# Patient Record
Sex: Female | Born: 1995 | Race: White | Hispanic: No | Marital: Single | Smoking: Never smoker
Health system: Southern US, Community
[De-identification: ages and names within clinical notes are randomized; demographics above are authoritative.]

## PROBLEM LIST (undated history)

## (undated) ENCOUNTER — Inpatient Hospital Stay (HOSPITAL_COMMUNITY): Payer: Self-pay

## (undated) DIAGNOSIS — J45909 Unspecified asthma, uncomplicated: Secondary | ICD-10-CM

---

## 2014-02-14 ENCOUNTER — Emergency Department (HOSPITAL_COMMUNITY): Payer: Medicaid Other

## 2014-02-14 ENCOUNTER — Emergency Department (HOSPITAL_COMMUNITY)
Admission: EM | Admit: 2014-02-14 | Discharge: 2014-02-15 | Disposition: A | Discharge status: Home / Self Care | Payer: Medicaid Other | Attending: Emergency Medicine | Admitting: Emergency Medicine

## 2014-02-14 ENCOUNTER — Encounter (HOSPITAL_COMMUNITY): Payer: Self-pay | Admitting: Emergency Medicine

## 2014-02-14 DIAGNOSIS — N9489 Other specified conditions associated with female genital organs and menstrual cycle: Secondary | ICD-10-CM | POA: Insufficient documentation

## 2014-02-14 DIAGNOSIS — Z79899 Other long term (current) drug therapy: Secondary | ICD-10-CM | POA: Insufficient documentation

## 2014-02-14 DIAGNOSIS — O020 Blighted ovum and nonhydatidiform mole: Secondary | ICD-10-CM

## 2014-02-14 DIAGNOSIS — O9989 Other specified diseases and conditions complicating pregnancy, childbirth and the puerperium: Secondary | ICD-10-CM | POA: Insufficient documentation

## 2014-02-14 DIAGNOSIS — O200 Threatened abortion: Secondary | ICD-10-CM

## 2014-02-14 DIAGNOSIS — R110 Nausea: Secondary | ICD-10-CM | POA: Insufficient documentation

## 2014-02-14 DIAGNOSIS — O039 Complete or unspecified spontaneous abortion without complication: Secondary | ICD-10-CM | POA: Insufficient documentation

## 2014-02-14 DIAGNOSIS — Z8619 Personal history of other infectious and parasitic diseases: Secondary | ICD-10-CM | POA: Insufficient documentation

## 2014-02-14 DIAGNOSIS — J45909 Unspecified asthma, uncomplicated: Secondary | ICD-10-CM | POA: Insufficient documentation

## 2014-02-14 HISTORY — DX: Unspecified asthma, uncomplicated: J45.909

## 2014-02-14 LAB — POCT PREGNANCY, URINE: PREG TEST UR: POSITIVE — AB

## 2014-02-14 LAB — CBC
HCT: 39.6 % (ref 36.0–46.0)
Hemoglobin: 14.6 g/dL (ref 12.0–15.0)
MCH: 33.3 pg (ref 26.0–34.0)
MCHC: 36.9 g/dL — AB (ref 30.0–36.0)
MCV: 90.2 fL (ref 78.0–100.0)
PLATELETS: 421 10*3/uL — AB (ref 150–400)
RBC: 4.39 MIL/uL (ref 3.87–5.11)
RDW: 11.9 % (ref 11.5–15.5)
WBC: 13.8 10*3/uL — ABNORMAL HIGH (ref 4.0–10.5)

## 2014-02-14 LAB — URINALYSIS, ROUTINE W REFLEX MICROSCOPIC
Bilirubin Urine: NEGATIVE
Glucose, UA: NEGATIVE mg/dL
Ketones, ur: NEGATIVE mg/dL
NITRITE: NEGATIVE
PROTEIN: NEGATIVE mg/dL
SPECIFIC GRAVITY, URINE: 1.017 (ref 1.005–1.030)
UROBILINOGEN UA: 0.2 mg/dL (ref 0.0–1.0)
pH: 6.5 (ref 5.0–8.0)

## 2014-02-14 LAB — HCG, QUANTITATIVE, PREGNANCY: HCG, BETA CHAIN, QUANT, S: 16398 m[IU]/mL — AB (ref <5)

## 2014-02-14 LAB — ABO/RH: ABO/RH(D): A POS

## 2014-02-14 LAB — URINE MICROSCOPIC-ADD ON

## 2014-02-14 LAB — WET PREP, GENITAL
Clue Cells Wet Prep HPF POC: NONE SEEN
Trich, Wet Prep: NONE SEEN
YEAST WET PREP: NONE SEEN

## 2014-02-14 MED ORDER — ACETAMINOPHEN 325 MG PO TABS
650.0000 mg | ORAL_TABLET | Freq: Once | ORAL | Status: AC
  Administered 2014-02-14: 650 mg via ORAL
  Filled 2014-02-14: qty 2

## 2014-02-14 MED ORDER — ONDANSETRON 4 MG PO TBDP
4.0000 mg | ORAL_TABLET | Freq: Once | ORAL | Status: AC
  Administered 2014-02-14: 4 mg via ORAL
  Filled 2014-02-14: qty 1

## 2014-02-14 NOTE — ED Notes (Signed)
PA at bedside.

## 2014-02-14 NOTE — ED Notes (Signed)
Escorted patient to ultrasound.

## 2014-02-14 NOTE — ED Notes (Signed)
Pt reports she is [redacted] weeks pregnant yesterday had vaginal bleeding and [facility] was told she was not having miscarriage. Bleeding stopped. Today at 1500 bleeding started back again. Reports both bright and dark red blood, no clots. This is first pregnancy. Lower abdominal cramping. Dx with trichomonas yesterday. Pt is a x 4. Significant other at bedside.

## 2014-02-14 NOTE — ED Provider Notes (Signed)
CSN: [id number]     Arrival date & time 02/14/14  1631 History   First MD Initiated Contact with Patient 02/14/14 1830     Chief Complaint  Patient presents with  . Vaginal Bleeding     (Consider location/radiation/quality/duration/timing/severity/associated sxs/prior Treatment) HPI Comments: Angela Huynh is a 52 G59P63 year-old female with a past medical history of Trichomonas and genital warts, presenting the Emergency Department with a chief complaint of abnormal bleeding since yesterday.  The patient reports she had light spotting yesterday and was evaluated at [facility] [facility] yesterday and was diagnosed with trichomonas.  She reports an increase in blood since yesterday and mild lower abdominal cramps.  She reports LNMP 12/14/2013 and pregnancy was confirmed by a UA at the [facility] department.The patient states she has not established care with an OB. She reports associated nausea that has been present through out the pregnancy.    Patient is a 18 y.o. female presenting with vaginal bleeding. The history is provided by the patient.  Vaginal Bleeding Associated symptoms: abdominal pain and nausea   Associated symptoms: no dysuria, no fever and no vaginal discharge     Past Medical History  Diagnosis Date  . Asthma    History reviewed. No pertinent past surgical history. No family history on file. History  Substance Use Topics  . Smoking status: Never Smoker   . Smokeless tobacco: Not on file  . Alcohol Use: No   OB History   Grav Para Term Preterm Abortions TAB SAB Ect Mult Living   1              Review of Systems  Constitutional: Negative for fever and chills.  Gastrointestinal: Positive for nausea and abdominal pain. Negative for diarrhea and constipation.  Genitourinary: Positive for vaginal bleeding, genital sores and pelvic pain. Negative for dysuria, flank pain and vaginal discharge.      Allergies  Review of patient's allergies  indicates no known allergies.  Home Medications   Current Outpatient Rx  Name  Route  Sig  Dispense  Refill  . albuterol (PROVENTIL HFA;VENTOLIN HFA) 108 (90 BASE) MCG/ACT inhaler   Inhalation   Inhale 2 puffs into the lungs every 6 (six) hours as needed for wheezing or shortness of breath.         . vitamin C (ASCORBIC ACID) 500 MG tablet   Oral   Take 1,000 mg by mouth daily as needed (feeling under the weather).         . metroNIDAZOLE (FLAGYL) 500 MG tablet   Oral   Take 2,000 mg by mouth once.          BP 116/70  Pulse 115  Temp(Src) 98 F (36.7 C) (Oral)  Resp 16  SpO2 100% Physical Exam  Nursing note and vitals reviewed. Constitutional: She is oriented to person, place, and time. She appears well-developed and well-nourished. No distress.  HENT:  Head: Normocephalic and atraumatic.  Eyes: EOM are normal. Pupils are equal, round, and reactive to light.  Neck: Neck supple.  Cardiovascular: Normal rate and regular rhythm.   Pulmonary/Chest: No respiratory distress. She has no wheezes. She has no rales.  Abdominal: Soft. She exhibits no mass. There is tenderness. There is no rebound and no guarding.  Genitourinary: There is lesion on the right labia. There is lesion on the left labia. Uterus is tender. Cervix exhibits discharge. Right adnexum displays no mass and no tenderness. Left adnexum displays no mass and no tenderness.  Cervical os slightly dilated.  Moderate amount of blood in the posterior vaginal vault.  Small clots no obvious tissue. Approximately 30 genital warts to bilateral labia.  Musculoskeletal: Normal range of motion.  Neurological: She is alert and oriented to person, place, and time.  Skin: Skin is warm and dry. She is not diaphoretic.  Psychiatric: She has a normal mood and affect. Her behavior is normal.    ED Course  Procedures (including critical care time) Labs Review Labs Reviewed  CBC - Abnormal; Notable for the following:    WBC  13.8 (*)    MCHC 36.9 (*)    Platelets 421 (*)    All other components within normal limits  URINALYSIS, ROUTINE W REFLEX MICROSCOPIC - Abnormal; Notable for the following:    APPearance HAZY (*)    Hgb urine dipstick LARGE (*)    Leukocytes, UA MODERATE (*)    All other components within normal limits  URINE MICROSCOPIC-ADD ON - Abnormal; Notable for the following:    Squamous Epithelial / LPF MANY (*)    Bacteria, UA FEW (*)    All other components within normal limits  POCT PREGNANCY, URINE - Abnormal; Notable for the following:    Preg Test, Ur POSITIVE (*)    All other components within normal limits  HCG, QUANTITATIVE, PREGNANCY  ABO/RH   Imaging Review US OB Transvaginal (Final result)  Result time: 02/14/14 23:25:05    Final result by Rad Results In Interface (02/14/14 23:25:05)    Narrative:   CLINICAL DATA: Bleeding.  EXAM: OBSTETRIC <14 WK US AND TRANSVAGINAL OB US  TECHNIQUE: Both transabdominal and transvaginal ultrasound examinations were performed for complete evaluation of the gestation as well as the maternal uterus, adnexal regions, and pelvic cul-de-sac. Transvaginal technique was performed to assess early pregnancy.  COMPARISON: None.  FINDINGS: Intrauterine gestational sac: Visualized/normal in shape.  Yolk sac: Not visualized  Embryo: Not visualized  Cardiac Activity: Not visualized  Heart Rate: bpm  MSD: 13.1 mm 6 w 1 d  CRL: mm w d US EDC: 10/09/2014  Maternal uterus/adnexae: No subchorionic hemorrhage. Ovaries are symmetric in size and echotexture. No adnexal masses. Small amount of free fluid in the pelvis.  IMPRESSION: Early intrauterine gestational sac without yolk sac or fetal pole currently. Recommend followup with serial quantitative beta HCGs and repeat ultrasound in 10-14 days.   Electronically Signed By: Charlett NoseKevin Dover M.D. On: 02/14/2014 23:25             US OB Comp Less 14 Wks (Final result)  Result time:  02/14/14 23:25:05    Final result by Rad Results In Interface (02/14/14 23:25:05)    Narrative:   CLINICAL DATA: Bleeding.  EXAM: OBSTETRIC <14 WK US AND TRANSVAGINAL OB US  TECHNIQUE: Both transabdominal and transvaginal ultrasound examinations were performed for complete evaluation of the gestation as well as the maternal uterus, adnexal regions, and pelvic cul-de-sac. Transvaginal technique was performed to assess early pregnancy.  COMPARISON: None.  FINDINGS: Intrauterine gestational sac: Visualized/normal in shape.  Yolk sac: Not visualized  Embryo: Not visualized  Cardiac Activity: Not visualized  Heart Rate: bpm  MSD: 13.1 mm 6 w 1 d  CRL: mm w d US EDC: 10/09/2014  Maternal uterus/adnexae: No subchorionic hemorrhage. Ovaries are symmetric in size and echotexture. No adnexal masses. Small amount of free fluid in the pelvis.  IMPRESSION: Early intrauterine gestational sac without yolk sac or fetal pole currently. Recommend followup with serial quantitative beta HCGs and repeat ultrasound  in 10-14 days.   Electronically Signed By: Charlett Nose M.D. On: 02/14/2014 23:25     EKG Interpretation   None       MDM   Final diagnoses:  Empty gestational sac with ongoing pregnancy  Threatened miscarriage    Pt with abnormal bleeding and cramping with a positive UA pregnancy test. Was evaluated by [facility] yesterday. PE shows moderate amount of blood and uterine tenderness.  Minimal dilation of the cervix. Concerning for immanent miscarriage. [place] to evaluate for ectopic. Hcg: 16398, WBC on wet prep without trich. US shows Intrauterine gestational sac without fetal pole, or yolk sac.  MSD 6w 1d. Discussed following up with an OB in 2 days for hCG and possible [place]. Discussed lab results, imaging results, and treatment plan with the patient. Return precautions given. Reports understanding and no other concerns at this time.  Patient is stable for discharge  at this time.     Clabe Seal, PA-C 02/16/14 2025

## 2014-02-15 ENCOUNTER — Encounter (HOSPITAL_COMMUNITY): Payer: Self-pay | Admitting: *Deleted

## 2014-02-15 ENCOUNTER — Inpatient Hospital Stay (HOSPITAL_COMMUNITY)
Admission: AD | Admit: 2014-02-15 | Discharge: 2014-02-15 | Disposition: A | Discharge status: Home / Self Care | Payer: Medicaid Other | Source: Ambulatory Visit | Attending: Obstetrics & Gynecology | Admitting: Obstetrics & Gynecology

## 2014-02-15 DIAGNOSIS — O209 Hemorrhage in early pregnancy, unspecified: Secondary | ICD-10-CM

## 2014-02-15 DIAGNOSIS — O210 Mild hyperemesis gravidarum: Secondary | ICD-10-CM | POA: Insufficient documentation

## 2014-02-15 DIAGNOSIS — R109 Unspecified abdominal pain: Secondary | ICD-10-CM | POA: Insufficient documentation

## 2014-02-15 DIAGNOSIS — O26859 Spotting complicating pregnancy, unspecified trimester: Secondary | ICD-10-CM | POA: Insufficient documentation

## 2014-02-15 LAB — GC/CHLAMYDIA PROBE AMP
CT PROBE, AMP APTIMA: NEGATIVE
GC PROBE AMP APTIMA: NEGATIVE

## 2014-02-15 LAB — HCG, QUANTITATIVE, PREGNANCY: hCG, Beta Chain, Quant, S: 19832 m[IU]/mL — ABNORMAL HIGH (ref <5)

## 2014-02-15 MED ORDER — PROMETHAZINE HCL 25 MG PO TABS: 25.0000 mg | ORAL_TABLET | Freq: Four times a day (QID) | ORAL | Status: DC | PRN

## 2014-02-15 MED ORDER — ONDANSETRON 8 MG PO TBDP: 8.0000 mg | ORAL_TABLET | Freq: Three times a day (TID) | ORAL | Status: DC | PRN

## 2014-02-15 MED ORDER — ONDANSETRON 8 MG PO TBDP
8.0000 mg | ORAL_TABLET | Freq: Once | ORAL | Status: AC
  Administered 2014-02-15: 8 mg via ORAL
  Filled 2014-02-15: qty 1

## 2014-02-15 MED ORDER — ACETAMINOPHEN 500 MG PO TABS
1000.0000 mg | ORAL_TABLET | Freq: Once | ORAL | Status: AC
  Administered 2014-02-15: 1000 mg via ORAL
  Filled 2014-02-15: qty 2

## 2014-02-15 NOTE — MAU Note (Signed)
Been 2 different doctors [facility]- dx with trich and treated; 2nd- [facility]- no trich- empty sac). Been having some bleeding started on Tues.   Is afraid she is having a miscarriage.

## 2014-02-15 NOTE — MAU Provider Note (Signed)
History     CSN: [id number]  Arrival date and time: 02/15/14 1419   None     No chief complaint on file.  HPI Pt is [redacted]w[redacted]d G1P0 that has been seen in [facility] with spotting and bleeding in pregnancy And then at [facility] yesterday.  Pt has had HCGs and then ultrasound yesterday.  Records from [facility] are not available to view at this time.  Pt has been spotting since 2/17 and is cramping. Pt was told to f/u here within 48 hours and is here for  Further evaluation.   HCG from 02/14/2014- 16398 US showed  IUGS no YS or embryo; normal ovaries on 02/14/2014 Pt was told she was having a miscarriage- pt is confused Pt also treated for trich - partner has not yet been treated GC/chlamydia from 02/14/2014 - neg  RN note: Been 2 different doctors [facility]- dx with trich and treated; 2nd- [facility]- no trich- empty sac). Been having some bleeding started on Tues. Is afraid she is having a miscarriage.    Started spotting on Tues night. Has increased. Cramping in lower abd.   Past Medical History  Diagnosis Date  . Asthma     History reviewed. No pertinent past surgical history.  History reviewed. No pertinent family history.  History  Substance Use Topics  . Smoking status: Never Smoker   . Smokeless tobacco: Not on file  . Alcohol Use: No    Allergies:  Allergies  Allergen Reactions  . Corn-Containing Products Nausea And Vomiting    Prescriptions prior to admission  Medication Sig Dispense Refill  . acetaminophen (TYLENOL) 500 MG tablet Take 1,000 mg by mouth every 6 (six) hours as needed for headache.      . metroNIDAZOLE (FLAGYL) 500 MG tablet Take 2,000 mg by mouth once.      . vitamin C (ASCORBIC ACID) 500 MG tablet Take 1,000 mg by mouth daily as needed (feeling under the weather).      Marland Kitchen albuterol (PROVENTIL HFA;VENTOLIN HFA) 108 (90 BASE) MCG/ACT inhaler Inhale 2 puffs into the lungs every 6 (six) hours as needed for wheezing or shortness of breath.         ROS Physical Exam   Blood pressure 118/71, pulse 89, temperature 98.5 F (36.9 C), temperature source Oral, resp. rate 18, height 4\' 11"  (1.499 m), weight 44.18 kg (97 lb 6.4 oz), last menstrual period 12/06/2013.  Physical Exam  Nursing note and vitals reviewed. Constitutional: She is oriented to person, place, and time. She appears well-developed and well-nourished. No distress.  HENT:  Head: Normocephalic.  Eyes: Pupils are equal, round, and reactive to light.  Neck: Normal range of motion. Neck supple.  Cardiovascular: Normal rate.   Respiratory: Effort normal.  GI: Soft.  Musculoskeletal: Normal range of motion.  Neurological: She is alert and oriented to person, place, and time.  Skin: Skin is warm and dry.  Psychiatric: She has a normal mood and affect.    MAU Course  Procedures  Review of labs and ultrasound from yesterday 02/14/2014: US Ob Comp Less 14 Wks  02/14/2014   CLINICAL DATA:  Bleeding.  EXAM: OBSTETRIC <14 WK [place] AND TRANSVAGINAL OB US  TECHNIQUE: Both transabdominal and transvaginal ultrasound examinations were performed for complete evaluation of the gestation as well as the maternal uterus, adnexal regions, and pelvic cul-de-sac. Transvaginal technique was performed to assess early pregnancy.  COMPARISON:  None.  FINDINGS: Intrauterine gestational sac: Visualized/normal in shape.  Yolk sac:  Not visualized  Embryo:  Not visualized  Cardiac Activity: Not visualized  Heart Rate:   bpm  MSD:  13.1  mm   6 w   1  d  CRL:     mm    w  d                  US EDC: 10/09/2014  Maternal uterus/adnexae: No subchorionic hemorrhage. Ovaries are symmetric in size and echotexture. No adnexal masses. Small amount of free fluid in the pelvis.  IMPRESSION: Early intrauterine gestational sac without yolk sac or fetal pole currently. Recommend followup with serial quantitative beta HCGs and repeat ultrasound in 10-14 days.   Electronically Signed   By: Charlett NoseKevin  Dover M.D.   On:  02/14/2014 23:25   [place] Ob Transvaginal  02/14/2014   CLINICAL DATA:  Bleeding.  EXAM: OBSTETRIC <14 WK US AND TRANSVAGINAL OB US  TECHNIQUE: Both transabdominal and transvaginal ultrasound examinations were performed for complete evaluation of the gestation as well as the maternal uterus, adnexal regions, and pelvic cul-de-sac. Transvaginal technique was performed to assess early pregnancy.  COMPARISON:  None.  FINDINGS: Intrauterine gestational sac: Visualized/normal in shape.  Yolk sac:  Not visualized  Embryo:  Not visualized  Cardiac Activity: Not visualized  Heart Rate:   bpm  MSD:  13.1  mm   6 w   1  d  CRL:     mm    w  d                  US EDC: 10/09/2014  Maternal uterus/adnexae: No subchorionic hemorrhage. Ovaries are symmetric in size and echotexture. No adnexal masses. Small amount of free fluid in the pelvis.  IMPRESSION: Early intrauterine gestational sac without yolk sac or fetal pole currently. Recommend followup with serial quantitative beta HCGs and repeat ultrasound in 10-14 days.   Electronically Signed   By: Charlett NoseKevin  Dover M.D.   On: 02/14/2014 23:25   Results for orders placed during the hospital encounter of 02/14/14 (from the past 48 hour(s))  CBC     Status: Abnormal   Collection Time    02/14/14  4:57 PM      Result Value Ref Range   WBC 13.8 (*) 4.0 - 10.5 K/uL   RBC 4.39  3.87 - 5.11 MIL/uL   Hemoglobin 14.6  12.0 - 15.0 g/dL   HCT 40.939.6  81.136.0 - 91.446.0 %   MCV 90.2  78.0 - 100.0 fL   MCH 33.3  26.0 - 34.0 pg   MCHC 36.9 (*) 30.0 - 36.0 g/dL   RDW 78.211.9  95.611.5 - 21.315.5 %   Platelets 421 (*) 150 - 400 K/uL  URINALYSIS, ROUTINE W REFLEX MICROSCOPIC     Status: Abnormal   Collection Time    02/14/14  5:03 PM      Result Value Ref Range   Color, Urine YELLOW  YELLOW   APPearance HAZY (*) CLEAR   Specific Gravity, Urine 1.017  1.005 - 1.030   pH 6.5  5.0 - 8.0   Glucose, UA NEGATIVE  NEGATIVE mg/dL   Hgb urine dipstick LARGE (*) NEGATIVE   Bilirubin Urine NEGATIVE  NEGATIVE    Ketones, ur NEGATIVE  NEGATIVE mg/dL   Protein, ur NEGATIVE  NEGATIVE mg/dL   Urobilinogen, UA 0.2  0.0 - 1.0 mg/dL   Nitrite NEGATIVE  NEGATIVE   Leukocytes, UA MODERATE (*) NEGATIVE  URINE MICROSCOPIC-ADD ON     Status:  Abnormal   Collection Time    02/14/14  5:03 PM      Result Value Ref Range   Squamous Epithelial / LPF MANY (*) RARE   WBC, UA 3-6  <3 WBC/hpf   RBC / HPF 7-10  <3 RBC/hpf   Bacteria, UA FEW (*) RARE   Urine-Other MUCOUS PRESENT    POCT PREGNANCY, URINE     Status: Abnormal   Collection Time    02/14/14  5:08 PM      Result Value Ref Range   Preg Test, Ur POSITIVE (*) NEGATIVE   Comment:            THE SENSITIVITY OF THIS     METHODOLOGY IS >24 mIU/mL  ABO/RH     Status: None   Collection Time    02/14/14  6:44 PM      Result Value Ref Range   ABO/RH(D) A POS     No rh immune globuloin NOT A RH IMMUNE GLOBULIN CANDIDATE, PT RH POSITIVE    HCG, QUANTITATIVE, PREGNANCY     Status: Abnormal   Collection Time    02/14/14  6:44 PM      Result Value Ref Range   hCG, Beta Chain, Quant, S [id number] (*) <5 mIU/mL   Comment:              GEST. AGE      CONC.  (mIU/mL)       <=1 WEEK        5 - 50         2 WEEKS       50 - 500         3 WEEKS       100 - 10,000         4 WEEKS     1,000 - 30,000         5 WEEKS     3,500 - 115,000       6-8 WEEKS     12,000 - 270,000        12 WEEKS     15,000 - 220,000                FEMALE AND NON-PREGNANT FEMALE:         LESS THAN 5 mIU/mL  GC/CHLAMYDIA PROBE AMP     Status: None   Collection Time    02/14/14  8:51 PM      Result Value Ref Range   CT Probe RNA NEGATIVE  NEGATIVE   GC Probe RNA NEGATIVE  NEGATIVE   Comment: (NOTE)                                                                                               **Normal Reference Range: Negative**          Assay performed using the Gen-Probe APTIMA COMBO2 (R) Assay.     Acceptable specimen types for this assay include APTIMA Swabs (Unisex,     endocervical,  urethral, or vaginal), first void urine, and ThinPrep     liquid based cytology samples.  Performed at [facility]  WET PREP, GENITAL     Status: Abnormal   Collection Time    02/14/14  8:51 PM      Result Value Ref Range   Yeast Wet Prep HPF POC NONE SEEN  NONE SEEN   Trich, Wet Prep NONE SEEN  NONE SEEN   Clue Cells Wet Prep HPF POC NONE SEEN  NONE SEEN   WBC, Wet Prep HPF POC MODERATE (*) NONE SEEN   Results for orders placed during the hospital encounter of 02/15/14 (from the past 24 hour(s))  HCG, QUANTITATIVE, PREGNANCY     Status: Abnormal   Collection Time    02/15/14  4:19 PM      Result Value Ref Range   hCG, Beta Chain, Quant, Kathie Rhodes [id number] (*) <5 mIU/mL   Discussed with Dr. Macon Large- will f/u in 48 hours for repeat HCG Pt started feeling nauseated and more cramping- Zofran 8mg  ODT and Tylenol 1000mg  PO given  Assessment and Plan  Bleeding in pregnancy F/u in 48 hrs for repeat HCG Bleeding precautions Morning sickness- Rx for Zofran and Phenergan- (phenergan called in to pharmancy; Zofran e-scribed) Keaundre Thelin 02/15/2014, 3:52 PM

## 2014-02-15 NOTE — ED Notes (Signed)
Pt is upset and crying.

## 2014-02-15 NOTE — MAU Provider Note (Signed)
Attestation of Attending Supervision of Advanced Practitioner (PA/CNM/NP): Evaluation and management procedures were performed by the Advanced Practitioner under my supervision and collaboration.  I have reviewed the Advanced Practitioner's note and chart, and I agree with the management and plan.  Francessca Friis, MD, FACOG Attending Obstetrician & Gynecologist Faculty Practice, [facility]  

## 2014-02-15 NOTE — Discharge Instructions (Signed)
[facility] for a follow up appointment for a repeat hCG (hormone of pregnancy) and a possible ultra-sound.  Call for a follow up appointment with a Family or Primary Care Provider.  Return if Symptoms worsen.   Take medication as prescribed. Take tylenol for pain.

## 2014-02-18 ENCOUNTER — Inpatient Hospital Stay (HOSPITAL_COMMUNITY)
Admission: AD | Admit: 2014-02-18 | Discharge: 2014-02-18 | Disposition: A | Discharge status: Home / Self Care | Payer: Medicaid Other | Source: Ambulatory Visit | Attending: Obstetrics & Gynecology | Admitting: Obstetrics & Gynecology

## 2014-02-18 ENCOUNTER — Encounter (HOSPITAL_COMMUNITY): Payer: Self-pay | Admitting: *Deleted

## 2014-02-18 DIAGNOSIS — O200 Threatened abortion: Secondary | ICD-10-CM

## 2014-02-18 DIAGNOSIS — O4690 Antepartum hemorrhage, unspecified, unspecified trimester: Secondary | ICD-10-CM

## 2014-02-18 LAB — HCG, QUANTITATIVE, PREGNANCY: hCG, Beta Chain, Quant, S: 25271 m[IU]/mL — ABNORMAL HIGH (ref <5)

## 2014-02-18 NOTE — MAU Provider Note (Signed)
Attestation of Attending Supervision of Advanced Practitioner (CNM/NP): Evaluation and management procedures were performed by the Advanced Practitioner under my supervision and collaboration. I have reviewed the Advanced Practitioner's note and chart, and I agree with the management and plan.  Kayshawn Ozburn H. 8:36 PM

## 2014-02-18 NOTE — MAU Note (Signed)
Pt presents for repeat labs

## 2014-02-18 NOTE — MAU Provider Note (Signed)
HPI:  [name] is a 18 y.o. female G1P0 at 7037w5d who presents to MAU for a repeat beta hcg level. The patient currently has no pain and very minimal bleeding. When patient was seen on 2/18 she described heavy vaginal bleeding; bleeding is very minimal now.    Objective:  GENERAL: Well-developed, well-nourished female in no acute distress.  HEENT: Normocephalic, atraumatic.   LUNGS: Effort normal HEART: Regular rate  SKIN: Warm, dry and without erythema PSYCH: Normal mood and affect    Results for orders placed during the hospital encounter of 02/18/14 (from the past 48 hour(s))  HCG, QUANTITATIVE, PREGNANCY     Status: Abnormal   Collection Time    02/18/14  3:21 PM      Result Value Ref Range   hCG, Beta Chain, Quant, S [id number] (*) <5 mIU/mL   Comment:              GEST. AGE      CONC.  (mIU/mL)       <=1 WEEK        5 - 50         2 WEEKS       50 - 500         3 WEEKS       100 - 10,000         4 WEEKS     1,000 - 30,000         5 WEEKS     3,500 - 115,000       6-8 WEEKS     12,000 - 270,000        12 WEEKS     15,000 - 220,000                FEMALE AND NON-PREGNANT FEMALE:         LESS THAN 5 mIU/mL    CLINICAL DATA: Bleeding.  EXAM:  OBSTETRIC <14 WK US AND TRANSVAGINAL OB US  TECHNIQUE:  Both transabdominal and transvaginal ultrasound examinations were  performed for complete evaluation of the gestation as well as the  maternal uterus, adnexal regions, and pelvic cul-de-sac.  Transvaginal technique was performed to assess early pregnancy.  COMPARISON: None.  FINDINGS:  Intrauterine gestational sac: Visualized/normal in shape.  Yolk sac: Not visualized  Embryo: Not visualized  Cardiac Activity: Not visualized  Heart Rate: bpm  MSD: 13.1 mm 6 w 1 d  CRL: mm w d US EDC: 10/09/2014  Maternal uterus/adnexae: No subchorionic hemorrhage. Ovaries are  symmetric in size and echotexture. No adnexal masses. Small amount  of free fluid in the pelvis.  IMPRESSION:   Early intrauterine gestational sac without yolk sac or fetal pole  currently. Recommend followup with serial quantitative beta HCGs and  repeat ultrasound in 10-14 days.  Electronically Signed  By: Charlett NoseKevin Dover M.D.  On: 02/14/2014 23:25    MDM  Quant 2/18: [id number] Quant 2/19: [id number]: [id number] Quant 2/21: [id number] A positive blood type  Consulted with Dr. Penne LashLeggett     A:  Threatened miscarriage, likely blighted ovum  Vaginal bleeding in pregnancy  P:  Discharge home in stable condition Repeat US in 10 days Return to MAU with any pain or increased bleeding Pelvic rest  Bleeding precautions discussed   Iona HansenJennifer Irene Lonzell Dorris, NP 02/18/2014 4:57 PM

## 2014-02-18 NOTE — Discharge Instructions (Signed)
Threatened Miscarriage  Bleeding during the first 20 weeks of pregnancy is common. This is sometimes called a threatened miscarriage. This is a pregnancy that is threatening to end before the twentieth week of pregnancy. Often this bleeding stops with bed rest or decreased activities as suggested by your caregiver and the pregnancy continues without any more problems. You may be asked to not have sexual intercourse, have orgasms or use tampons until further notice. Sometimes a threatened miscarriage can progress to a complete or incomplete miscarriage. This may or may not require further treatment. Some miscarriages occur before a woman misses a menstrual period and knows she is pregnant.  Miscarriages occur in 15 to 20% of all pregnancies and usually occur during the first 13 weeks of the pregnancy. The exact cause of a miscarriage is usually never known. A miscarriage is natures way of ending a pregnancy that is abnormal or would not make it to term. There are some things that may put you at risk to have a miscarriage, such as:  · Hormone problems.  · Infection of the uterus or cervix.  · Chronic illness, diabetes for example, especially if it is not controlled.  · Abnormal shaped uterus.  · Fibroids in the uterus.  · Incompetent cervix (the cervix is too weak to hold the baby).  · Smoking.  · Drinking too much alcohol. It's best not to drink any alcohol when you are pregnant.  · Taking illegal drugs.  TREATMENT   When a miscarriage becomes complete and all products of conception (all the tissue in the uterus) have been passed, often no treatment is needed. If you think you passed tissue, save it in a container and take it to your doctor for evaluation. If the miscarriage is incomplete (parts of the fetus or placenta remain in the uterus), further treatment may be needed. The most common reason for further treatment is continued bleeding (hemorrhage) because pregnancy tissue did not pass out of the uterus. This  often occurs if a miscarriage is incomplete. Tissue left behind may also become infected. Treatment usually is dilatation and curettage (the removal of the remaining products of pregnancy. This can be done by a simple sucking procedure (suction curettage) or a simple scraping of the inside of the uterus. This may be done in the hospital or in the caregiver's office. This is only done when your caregiver knows that there is no chance for the pregnancy to proceed to term. This is determined by physical examination, negative pregnancy test, falling pregnancy hormone count and/or, an ultrasound revealing a dead fetus.  Miscarriages are often a very emotional time for prospective mothers and fathers. This is not you or your partners fault. It did not occur because of an inadequacy in you or your partner. Nearly all miscarriages occur because the pregnancy has started off wrongly. At least half of these pregnancies have a chromosomal abnormality. It is almost always not inherited. Others may have developmental problems with the fetus or placenta. This does not always show up even when the products miscarried are studied under the microscope. The miscarriage is nearly always not your fault and it is not likely that you could have prevented it from happening. If you are having emotional and grieving problems, talk to your health care provider and even seek counseling, if necessary, before getting pregnant again. You can begin trying for another pregnancy as soon as your caregiver says it is OK.  HOME CARE INSTRUCTIONS   · Your caregiver may order   bed rest depending on how much bleeding and cramping you are having. You may be limited to only getting up to go to the bathroom. You may be allowed to continue light activity. You may need to make arrangements for the care of your other children and for any other responsibilities.  · Keep track of the number of pads you use each day, how often you have to change pads and how  saturated (soaked) they are. Record this information.  · DO NOT USE TAMPONS. Do not douche, have sexual intercourse or orgasms until approved by your caregiver.  · You may receive a follow up appointment for re-evaluation of your pregnancy and a repeat blood test. Re-evaluation often occurs after 2 days and again in 4 to 6 weeks. It is very important that you follow-up in the recommended time period.  · If you are Rh negative and the father is Rh positive or you do not know the fathers' blood type, you may receive a shot (Rh immune globulin) to help prevent abnormal antibodies that can develop and affect the baby in any future pregnancies.  SEEK IMMEDIATE MEDICAL CARE IF:  · You have severe cramps in your stomach, back, or abdomen.  · You have a sudden onset of severe pain in the lower part of your abdomen.  · You develop chills.  · You run an unexplained temperature of 101° F (38.3° C) or higher.  · You pass large clots or tissue. Save any tissue for your caregiver to inspect.  · Your bleeding increases or you become light-headed, weak, or have fainting episodes.  · You have a gush of fluid from your vagina.  · You pass out. This could mean you have a tubal (ectopic) pregnancy.  Document Released: 12/14/2005 Document Revised: 03/07/2012 Document Reviewed: 07/30/2008  ExitCare® Patient Information ©2014 [employer].

## 2014-02-18 NOTE — ED Provider Notes (Signed)
Medical screening examination/treatment/procedure(s) were performed by non-physician practitioner and as supervising physician I was immediately available for consultation/collaboration.   Angela ChurnJohn David Amey Hossain III, MD 02/18/14 [phone]

## 2014-02-23 ENCOUNTER — Ambulatory Visit (HOSPITAL_COMMUNITY)
Admission: RE | Admit: 2014-02-23 | Discharge: 2014-02-23 | Disposition: A | Discharge status: Home / Self Care | Payer: Medicaid Other | Source: Ambulatory Visit | Attending: Obstetrics and Gynecology | Admitting: Obstetrics and Gynecology

## 2014-02-23 ENCOUNTER — Inpatient Hospital Stay (HOSPITAL_COMMUNITY)
Admission: AD | Admit: 2014-02-23 | Discharge: 2014-02-23 | Disposition: A | Discharge status: Home / Self Care | Payer: Medicaid Other | Source: Ambulatory Visit | Attending: Obstetrics & Gynecology | Admitting: Obstetrics & Gynecology

## 2014-02-23 DIAGNOSIS — O4690 Antepartum hemorrhage, unspecified, unspecified trimester: Secondary | ICD-10-CM

## 2014-02-23 DIAGNOSIS — O200 Threatened abortion: Secondary | ICD-10-CM | POA: Insufficient documentation

## 2014-02-23 DIAGNOSIS — Z09 Encounter for follow-up examination after completed treatment for conditions other than malignant neoplasm: Secondary | ICD-10-CM

## 2014-02-23 NOTE — Discharge Instructions (Signed)
Threatened Miscarriage  Bleeding during the first 20 weeks of pregnancy is common. This is sometimes called a threatened miscarriage. This is a pregnancy that is threatening to end before the twentieth week of pregnancy. Often this bleeding stops with bed rest or decreased activities as suggested by your caregiver and the pregnancy continues without any more problems. You may be asked to not have sexual intercourse, have orgasms or use tampons until further notice. Sometimes a threatened miscarriage can progress to a complete or incomplete miscarriage. This may or may not require further treatment. Some miscarriages occur before a woman misses a menstrual period and knows she is pregnant.  Miscarriages occur in 15 to 20% of all pregnancies and usually occur during the first 13 weeks of the pregnancy. The exact cause of a miscarriage is usually never known. A miscarriage is natures way of ending a pregnancy that is abnormal or would not make it to term. There are some things that may put you at risk to have a miscarriage, such as:  · Hormone problems.  · Infection of the uterus or cervix.  · Chronic illness, diabetes for example, especially if it is not controlled.  · Abnormal shaped uterus.  · Fibroids in the uterus.  · Incompetent cervix (the cervix is too weak to hold the baby).  · Smoking.  · Drinking too much alcohol. It's best not to drink any alcohol when you are pregnant.  · Taking illegal drugs.  TREATMENT   When a miscarriage becomes complete and all products of conception (all the tissue in the uterus) have been passed, often no treatment is needed. If you think you passed tissue, save it in a container and take it to your doctor for evaluation. If the miscarriage is incomplete (parts of the fetus or placenta remain in the uterus), further treatment may be needed. The most common reason for further treatment is continued bleeding (hemorrhage) because pregnancy tissue did not pass out of the uterus. This  often occurs if a miscarriage is incomplete. Tissue left behind may also become infected. Treatment usually is dilatation and curettage (the removal of the remaining products of pregnancy. This can be done by a simple sucking procedure (suction curettage) or a simple scraping of the inside of the uterus. This may be done in the hospital or in the caregiver's office. This is only done when your caregiver knows that there is no chance for the pregnancy to proceed to term. This is determined by physical examination, negative pregnancy test, falling pregnancy hormone count and/or, an ultrasound revealing a dead fetus.  Miscarriages are often a very emotional time for prospective mothers and fathers. This is not you or your partners fault. It did not occur because of an inadequacy in you or your partner. Nearly all miscarriages occur because the pregnancy has started off wrongly. At least half of these pregnancies have a chromosomal abnormality. It is almost always not inherited. Others may have developmental problems with the fetus or placenta. This does not always show up even when the products miscarried are studied under the microscope. The miscarriage is nearly always not your fault and it is not likely that you could have prevented it from happening. If you are having emotional and grieving problems, talk to your health care provider and even seek counseling, if necessary, before getting pregnant again. You can begin trying for another pregnancy as soon as your caregiver says it is OK.  HOME CARE INSTRUCTIONS   · Your caregiver may order   bed rest depending on how much bleeding and cramping you are having. You may be limited to only getting up to go to the bathroom. You may be allowed to continue light activity. You may need to make arrangements for the care of your other children and for any other responsibilities.  · Keep track of the number of pads you use each day, how often you have to change pads and how  saturated (soaked) they are. Record this information.  · DO NOT USE TAMPONS. Do not douche, have sexual intercourse or orgasms until approved by your caregiver.  · You may receive a follow up appointment for re-evaluation of your pregnancy and a repeat blood test. Re-evaluation often occurs after 2 days and again in 4 to 6 weeks. It is very important that you follow-up in the recommended time period.  · If you are Rh negative and the father is Rh positive or you do not know the fathers' blood type, you may receive a shot (Rh immune globulin) to help prevent abnormal antibodies that can develop and affect the baby in any future pregnancies.  SEEK IMMEDIATE MEDICAL CARE IF:  · You have severe cramps in your stomach, back, or abdomen.  · You have a sudden onset of severe pain in the lower part of your abdomen.  · You develop chills.  · You run an unexplained temperature of 101° F (38.3° C) or higher.  · You pass large clots or tissue. Save any tissue for your caregiver to inspect.  · Your bleeding increases or you become light-headed, weak, or have fainting episodes.  · You have a gush of fluid from your vagina.  · You pass out. This could mean you have a tubal (ectopic) pregnancy.  Document Released: 12/14/2005 Document Revised: 03/07/2012 Document Reviewed: 07/30/2008  ExitCare® Patient Information ©2014 [employer].

## 2014-02-23 NOTE — MAU Note (Signed)
Patient states she was seen in U/S today and sent to MAU for evaluation. Patient denies pain or bleeding.

## 2014-02-23 NOTE — MAU Provider Note (Signed)
History     CSN: [id number]  Arrival date and time: 02/23/14 1725   None     No chief complaint on file.  HPI Angela Huynh is 18 y.o. G1P0 3682w3d weeks presenting after follow up U/S.  She has been followed for heavy vaginal bleeding in early pregnancy.  On 2/18 BHCG  16.398; on 2/19 56,21319,832 and on 2/22 25,271.  U/S X 2 have not seen YS or FP.  Suspicious for blighted ovum.  She is having a little spotting.  Neg for abdominal/pelvic pain.  She is here with her boyfriend.     Past Medical History  Diagnosis Date  . Asthma     No past surgical history on file.  No family history on file.  History  Substance Use Topics  . Smoking status: Never Smoker   . Smokeless tobacco: Not on file  . Alcohol Use: No    Allergies:  Allergies  Allergen Reactions  . Corn-Containing Products Nausea And Vomiting    Prescriptions prior to admission  Medication Sig Dispense Refill  . acetaminophen (TYLENOL) 500 MG tablet Take 1,000 mg by mouth every 6 (six) hours as needed for headache.      . albuterol (PROVENTIL HFA;VENTOLIN HFA) 108 (90 BASE) MCG/ACT inhaler Inhale 2 puffs into the lungs every 6 (six) hours as needed for wheezing or shortness of breath.      . metroNIDAZOLE (FLAGYL) 500 MG tablet Take 2,000 mg by mouth once.      . ondansetron (ZOFRAN ODT) 8 MG disintegrating tablet Take 1 tablet (8 mg total) by mouth every 8 (eight) hours as needed for nausea or vomiting.  20 tablet  0  . ondansetron (ZOFRAN ODT) 8 MG disintegrating tablet Take 1 tablet (8 mg total) by mouth every 8 (eight) hours as needed for nausea or vomiting.  20 tablet  0  . promethazine (PHENERGAN) 25 MG tablet Take 1 tablet (25 mg total) by mouth every 6 (six) hours as needed for nausea or vomiting.  30 tablet  0  . promethazine (PHENERGAN) 25 MG tablet Take 1 tablet (25 mg total) by mouth every 6 (six) hours as needed for nausea or vomiting.  30 tablet  0  . vitamin C (ASCORBIC ACID) 500 MG tablet Take 1,000 mg by  mouth daily as needed (feeling under the weather).        Review of Systems  Gastrointestinal: Negative for abdominal pain.  Genitourinary:       + for spotting   Physical Exam   Last menstrual period 12/06/2013.  Physical Exam  Constitutional: She is oriented to person, place, and time. She appears well-developed and well-nourished. No distress.  HENT:  Head: Normocephalic.  Genitourinary:  Exam not indicated  Neurological: She is alert and oriented to person, place, and time.  Psychiatric: She has a normal mood and affect. Her behavior is normal.    [place] Ob Transvaginal  02/23/2014   CLINICAL DATA:  Threatened abortion.  EXAM: TRANSVAGINAL OB ULTRASOUND  TECHNIQUE: Transvaginal ultrasound was performed for complete evaluation of the gestation as well as the maternal uterus, adnexal regions, and pelvic cul-de-sac.  COMPARISON:  US OB TRANSVAGINAL dated 02/14/2014  FINDINGS: Intrauterine gestational sac: Single.  Yolk sac:  Absent.  Embryo:  Absent.  Cardiac Activity: Absent.  MSD: 21  mm   7 w   1  d  Maternal uterus/adnexae: No subchorionic hemorrhage. Ovaries are unremarkable with a probable corpus luteum cyst on the right. Small free fluid.  IMPRESSION: 1. Empty gestational sac measuring 21 mm. Findings are suspicious but not yet definitive for failed pregnancy. Recommend follow-up US in 10-14 days for definitive diagnosis. This recommendation follows SRU consensus guidelines: Diagnostic Criteria for Nonviable Pregnancy Early in the First Trimester. Malva Limes Med 2013; 578:4696-29. 2. Small free fluid.   Electronically Signed   By: Angela Huynh M.D.   On: 02/23/2014 19:20    MAU Course  Procedures  MDM Discussed past lab and ultrasound findings and tonight's ultrasound with Dr. Marice Huynh.  WIll wait 10-14 days for repeat U/S.  With BHCGs that did not double on the 22nd, this most likely is a failed pregnancy.  Discussed findings with the patient.  This is a wanted pregnancy and their  desire to have repeat U/S.  Discussed signs of miscarriage with them.    Assessment and Plan  A:  Ultrasound showing empty sac at [redacted]w[redacted]d gestation      Threatened miscarriage  P:  Discussed U/S findings and plan of care with the patient      Return before that date for heavy bleeding or abdominal pain.  Angela Huynh,EVE M 02/23/2014, 5:45 PM

## 2014-02-28 ENCOUNTER — Inpatient Hospital Stay (HOSPITAL_COMMUNITY): Payer: Medicaid Other

## 2014-02-28 ENCOUNTER — Inpatient Hospital Stay (HOSPITAL_COMMUNITY)
Admission: AD | Admit: 2014-02-28 | Discharge: 2014-02-28 | Disposition: A | Discharge status: Home / Self Care | Payer: Medicaid Other | Source: Ambulatory Visit | Attending: Obstetrics and Gynecology | Admitting: Obstetrics and Gynecology

## 2014-02-28 ENCOUNTER — Encounter (HOSPITAL_COMMUNITY): Payer: Self-pay | Admitting: *Deleted

## 2014-02-28 DIAGNOSIS — O039 Complete or unspecified spontaneous abortion without complication: Secondary | ICD-10-CM | POA: Insufficient documentation

## 2014-02-28 DIAGNOSIS — R109 Unspecified abdominal pain: Secondary | ICD-10-CM | POA: Insufficient documentation

## 2014-02-28 LAB — CBC
HEMATOCRIT: 34.2 % — AB (ref 36.0–46.0)
HEMOGLOBIN: 12.5 g/dL (ref 12.0–15.0)
MCH: 32.5 pg (ref 26.0–34.0)
MCHC: 36.5 g/dL — ABNORMAL HIGH (ref 30.0–36.0)
MCV: 88.8 fL (ref 78.0–100.0)
Platelets: 323 10*3/uL (ref 150–400)
RBC: 3.85 MIL/uL — ABNORMAL LOW (ref 3.87–5.11)
RDW: 12.2 % (ref 11.5–15.5)
WBC: 14.9 10*3/uL — AB (ref 4.0–10.5)

## 2014-02-28 LAB — HCG, QUANTITATIVE, PREGNANCY: hCG, Beta Chain, Quant, S: 15397 m[IU]/mL — ABNORMAL HIGH (ref <5)

## 2014-02-28 MED ORDER — IBUPROFEN 600 MG PO TABS: 600.0000 mg | ORAL_TABLET | Freq: Four times a day (QID) | ORAL | Status: DC | PRN

## 2014-02-28 MED ORDER — METHYLERGONOVINE MALEATE 0.2 MG/ML IJ SOLN
0.2000 mg | Freq: Once | INTRAMUSCULAR | Status: AC
  Administered 2014-02-28: 0.2 mg via INTRAMUSCULAR
  Filled 2014-02-28: qty 1

## 2014-02-28 MED ORDER — MORPHINE SULFATE 4 MG/ML IJ SOLN: 4.0000 mg | Freq: Once | INTRAMUSCULAR | Status: DC

## 2014-02-28 MED ORDER — MORPHINE SULFATE 4 MG/ML IJ SOLN
4.0000 mg | Freq: Once | INTRAMUSCULAR | Status: AC
  Administered 2014-02-28: 4 mg via INTRAMUSCULAR
  Filled 2014-02-28: qty 1

## 2014-02-28 NOTE — MAU Provider Note (Signed)
Chief Complaint: Vaginal Bleeding  First Provider Initiated Contact with Patient 02/28/14 0205     SUBJECTIVE HPI: Angela Huynh is a 18 y.o. G1P0 at 796w1d by LMP who presents with heavy vaginal bleeding, cramping and possible passage of tissue. Has been seen in maternity admissions twice in the past month with ultrasounds concerning for blighted ovum. Moderate gush of blood while in tree which, but bleeding has decreased significantly since 10. Requests pain medication for low abdominal cramping.  Results for Angela PaBROWN, Unika (MRN [id number]) as of 03/01/2014 07:24  Ref. Range 02/15/2014 16:19 02/18/2014 15:21  hCG, Beta Chain, Quant, S Latest Range: <5 mIU/mL 19832 (H) 25271 (H)    Past Medical History  Diagnosis Date  . Asthma    OB History  Gravida Para Term Preterm AB SAB TAB Ectopic Multiple Living  1             # Outcome Date GA Lbr Len/2nd Weight Sex Delivery Anes PTL Lv  1 CUR              History reviewed. No pertinent past surgical history. History   Social History  . Marital Status: Single    Spouse Name: N/A    Number of Children: N/A  . Years of Education: N/A   Occupational History  . Not on file.   Social History Main Topics  . Smoking status: Never Smoker   . Smokeless tobacco: Not on file  . Alcohol Use: No  . Drug Use: No  . Sexual Activity: Not on file   Other Topics Concern  . Not on file   Social History Narrative  . No narrative on file   No current facility-administered medications on file prior to encounter.   Current Outpatient Prescriptions on File Prior to Encounter  Medication Sig Dispense Refill  . ondansetron (ZOFRAN ODT) 8 MG disintegrating tablet Take 1 tablet (8 mg total) by mouth every 8 (eight) hours as needed for nausea or vomiting.  20 tablet  0  . promethazine (PHENERGAN) 25 MG tablet Take 1 tablet (25 mg total) by mouth every 6 (six) hours as needed for nausea or vomiting.  30 tablet  0  . acetaminophen (TYLENOL) 500 MG tablet Take  1,000 mg by mouth every 6 (six) hours as needed for headache.      . vitamin C (ASCORBIC ACID) 500 MG tablet Take 1,000 mg by mouth daily as needed (feeling under the weather).       Allergies  Allergen Reactions  . Corn-Containing Products Nausea And Vomiting    ROS: Pertinent positive items in HPI. Negative for fever, chills, urinary complaints, dizziness, syncope.  OBJECTIVE Blood pressure 106/64, pulse 92, temperature 98.4 F (36.9 C), temperature source Oral, resp. rate 18, height 4\' 11"  (1.499 m), weight 43.999 kg (97 lb), last menstrual period 12/06/2013, SpO2 99.00%. GENERAL: Well-developed, well-nourished female in mild-moderate distress. Tearful. HEENT: Normocephalic HEART: normal rate RESP: normal effort ABDOMEN: Soft, non-tender.  EXTREMITIES: Nontender, no edema NEURO: Alert and oriented PELVIC EXAM: NEFG, small amount of bright red blood, scant active bleeding.   LAB RESULTS Results for orders placed during the hospital encounter of 02/28/14 (from the past 168 hour(s))  CBC   Collection Time    02/28/14  2:10 AM      Result Value Ref Range   WBC 14.9 (*) 4.0 - 10.5 K/uL   RBC 3.85 (*) 3.87 - 5.11 MIL/uL   Hemoglobin 12.5  12.0 - 15.0 g/dL   HCT 40.934.2 (*)  36.0 - 46.0 %   MCV 88.8  78.0 - 100.0 fL   MCH 32.5  26.0 - 34.0 pg   MCHC 36.5 (*) 30.0 - 36.0 g/dL   RDW 16.1  09.6 - 04.5 %   Platelets 323  150 - 400 K/uL  HCG, QUANTITATIVE, PREGNANCY   Collection Time    02/28/14  2:10 AM      Result Value Ref Range   hCG, Beta Chain, Quant, [place] [address] (*) <5 mIU/mL     IMAGING US Ob Transvaginal  02/28/2014   CLINICAL DATA:  Question spontaneous abortion.  EXAM: TRANSVAGINAL OB ULTRASOUND  TECHNIQUE: Transvaginal ultrasound was performed for complete evaluation of the gestation as well as the maternal uterus, adnexal regions, and pelvic cul-de-sac.  COMPARISON:  Ob ultrasound 02/23/2014.  FINDINGS: An intrauterine gestational sac is no longer visible. The endometrium  contains heterogeneous endometrial lining and blood products, with multi focal vascular tissue in the endometrial cavity. The ovaries are symmetric in size and normal in appearance. No free pelvic fluid.  IMPRESSION: Failed pregnancy. Gestational products and blood is noted within the endometrial cavity.   Electronically Signed   By: Tiburcio Pea M.D.   On: 02/28/2014 02:38    MAU COURSE CBC, ultrasound, morphine, Quant, n.p.o. Methergine given when failed pregnancy confirmed.  Small amount of bleeding throughout remainder of observation in maternity admissions. Ambulating without dizziness.   ASSESSMENT 1. Complete miscarriage    PLAN Discharge home in stable condition. Support given. Offered chaplain, declined. Bleeding precautions.     Follow-up Information   Follow up with [facility]. (Will call you to schedule an appointment in 1 to 2 weeks. )    Specialty:  Obstetrics and Gynecology   Contact information:   [address] [place] [address] [phone]      Follow up with [facility] [place] [facility]. (As needed in emergencies)    Contact information:   [address] [id number] [place] [address] [phone]       Medication List         acetaminophen 500 MG tablet  Commonly known as:  TYLENOL  Take 1,000 mg by mouth every 6 (six) hours as needed for headache.     ibuprofen 600 MG tablet  Commonly known as:  ADVIL,MOTRIN  Take 1 tablet (600 mg total) by mouth every 6 (six) hours as needed for cramping.     ondansetron 8 MG disintegrating tablet  Commonly known as:  ZOFRAN ODT  Take 1 tablet (8 mg total) by mouth every 8 (eight) hours as needed for nausea or vomiting.     promethazine 25 MG tablet  Commonly known as:  PHENERGAN  Take 1 tablet (25 mg total) by mouth every 6 (six) hours as needed for nausea or vomiting.     vitamin C 500 MG tablet  Commonly known as:  ASCORBIC ACID  Take 1,000  mg by mouth daily as needed (feeling under the weather).       [place], CNM 02/28/2014  4:25 AM

## 2014-02-28 NOTE — MAU Note (Signed)
PT SAYS  AT 12 MN   SHE STARTED CRAMPING AND THEN STARTED BLEEDING HEAVY- THEN PASSED TISSUE.  SAYS NOW CRAMPS SLIGHT LESS.   SMALL AMT BLEEDING ON PAD.

## 2014-02-28 NOTE — MAU Note (Signed)
Pt states she has been coming here for bleeding with her pregnancy, tonight she thinks she miscarried at home.

## 2014-03-05 IMAGING — US US OB TRANSVAGINAL
1 series · 14 of 28 positions shown · non-contrast
Comparison: None.

CLINICAL DATA: Bleeding.

EXAM:
OBSTETRIC <14 WK US AND TRANSVAGINAL OB US
TECHNIQUE: Both transabdominal and transvaginal ultrasound examinations were
performed for complete evaluation of the gestation as well as the
maternal uterus, adnexal regions, and pelvic cul-de-sac.
Transvaginal technique was performed to assess early pregnancy.

[Series 1: us ob transvaginal · 0.20mm/px · 14 of 31 slices shown]
[im 2/31]
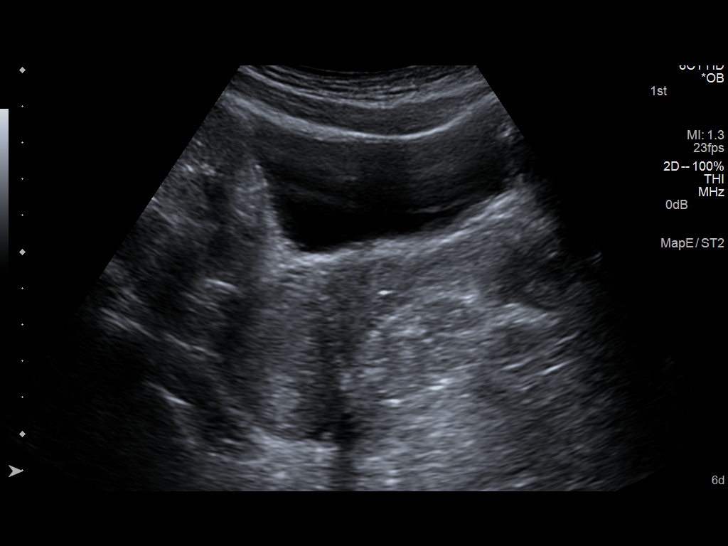
[im 4/31]
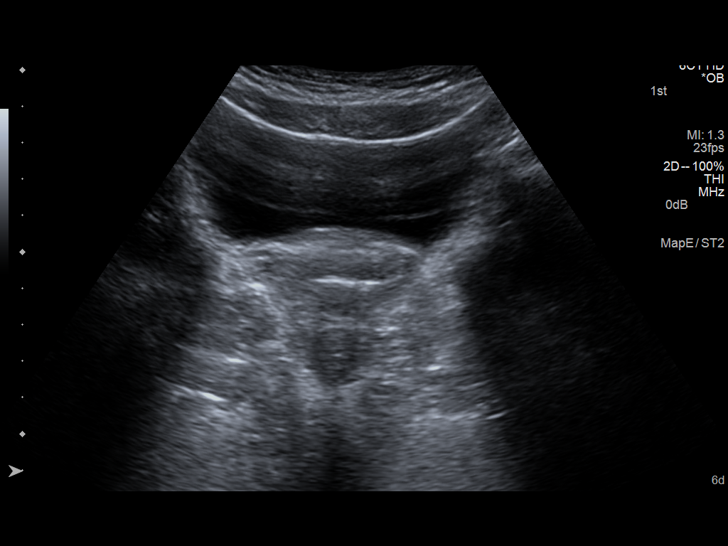
[im 6/31]
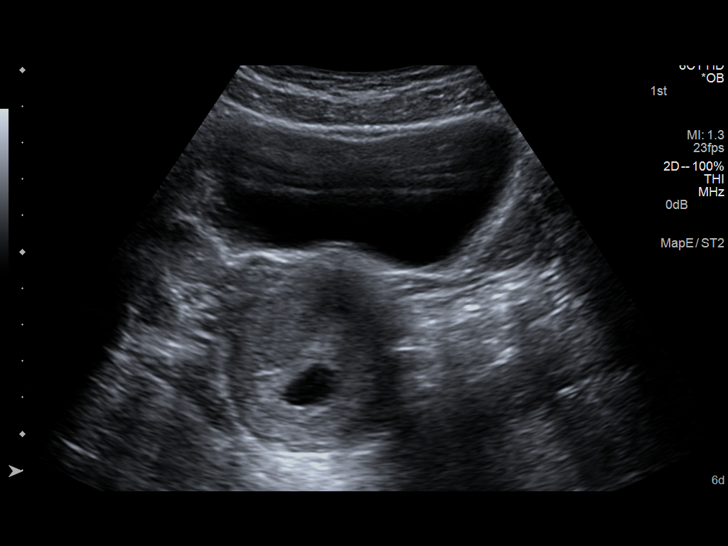
[im 8/31]
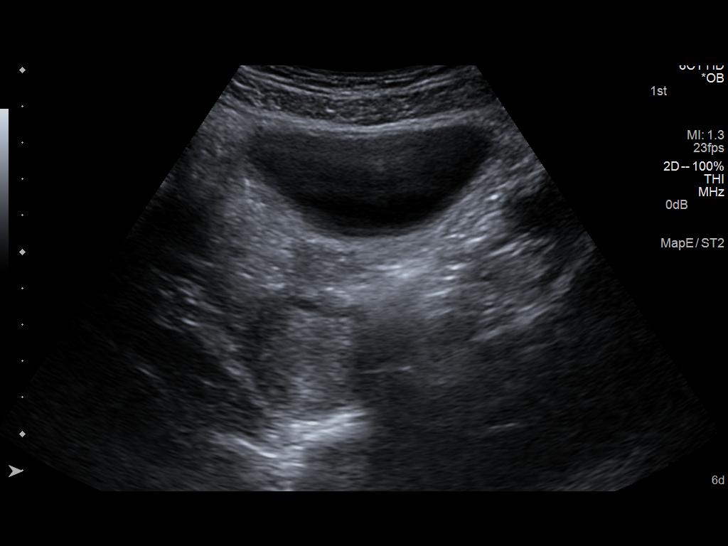
[im 11/31]
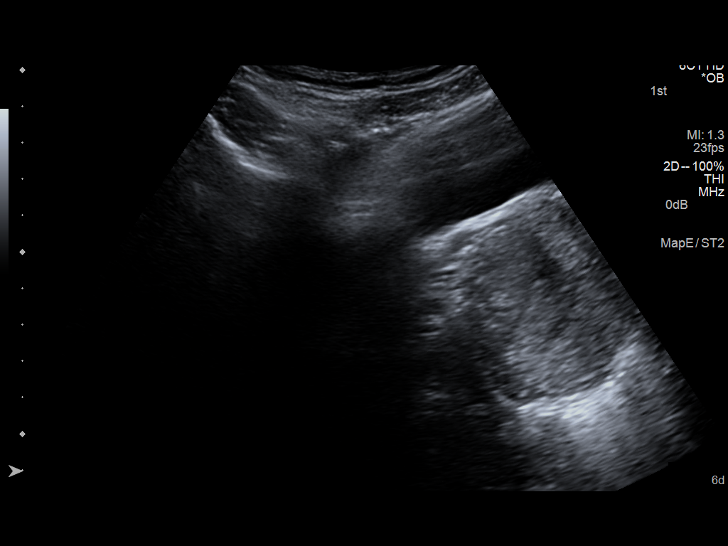
[im 13/31]
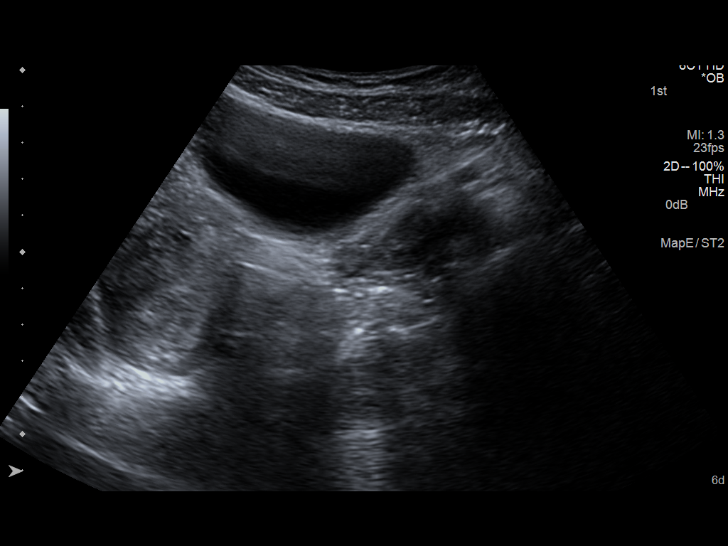
[im 15/31]
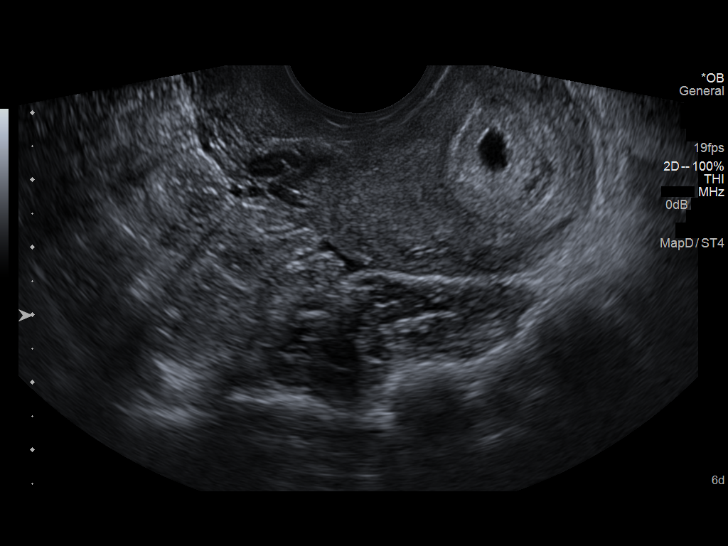
[im 17/31]
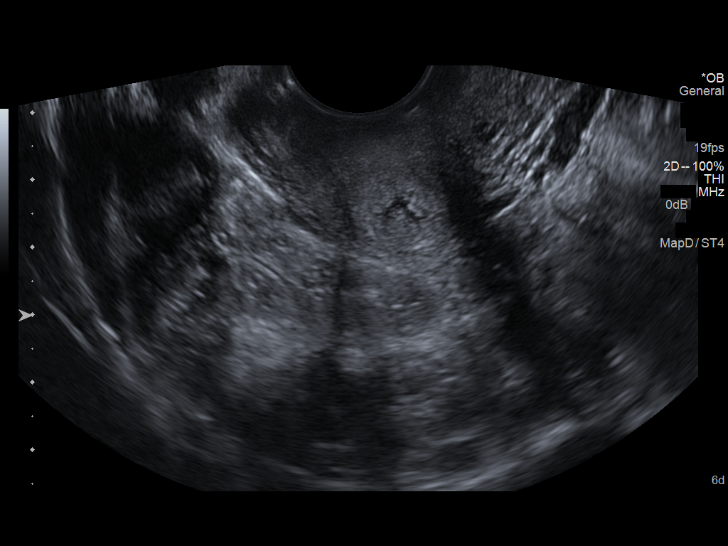
[im 19/31]
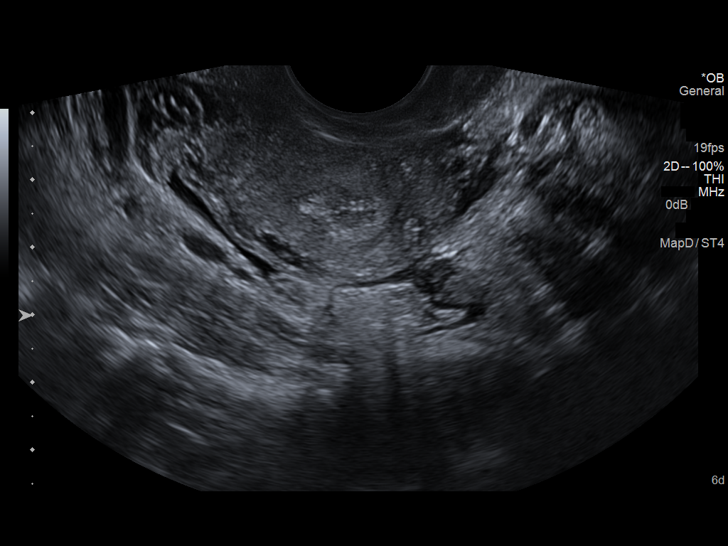
[im 22/31]
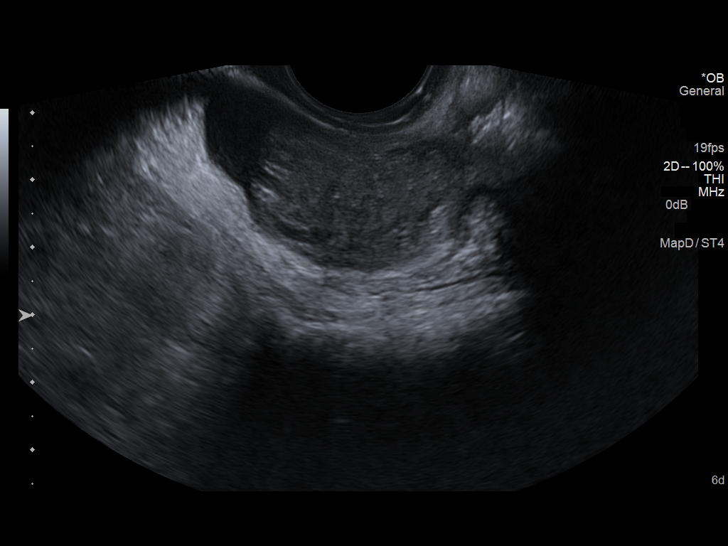
[im 24/31]
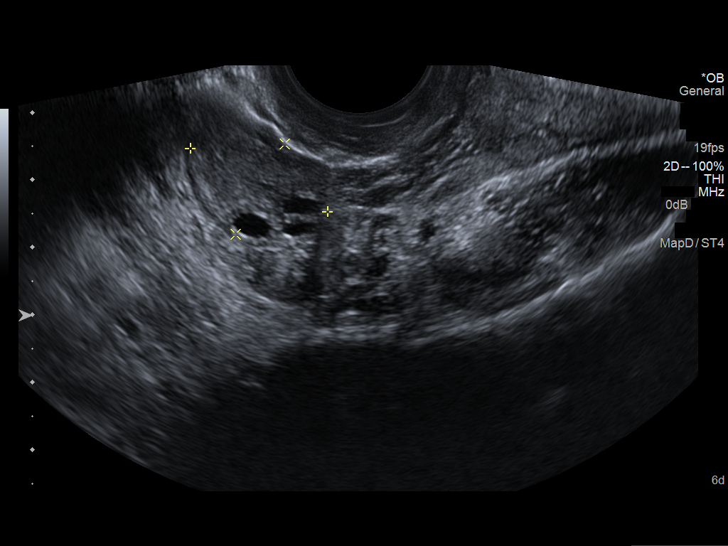
[im 26/31]
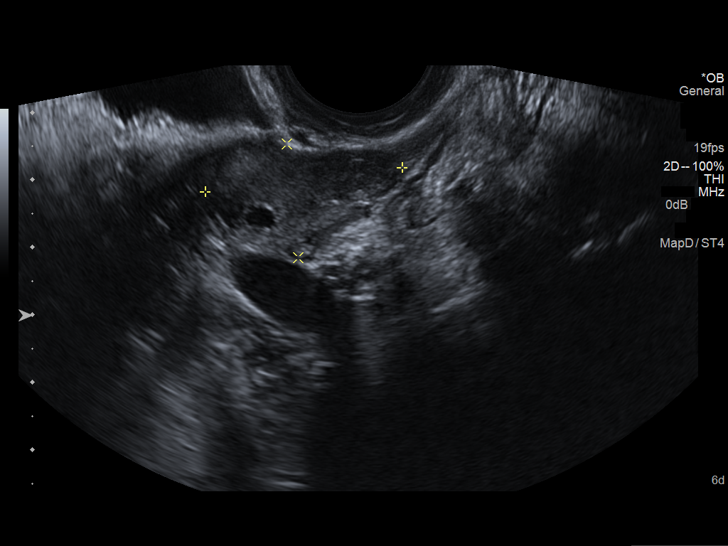
[im 28/31]
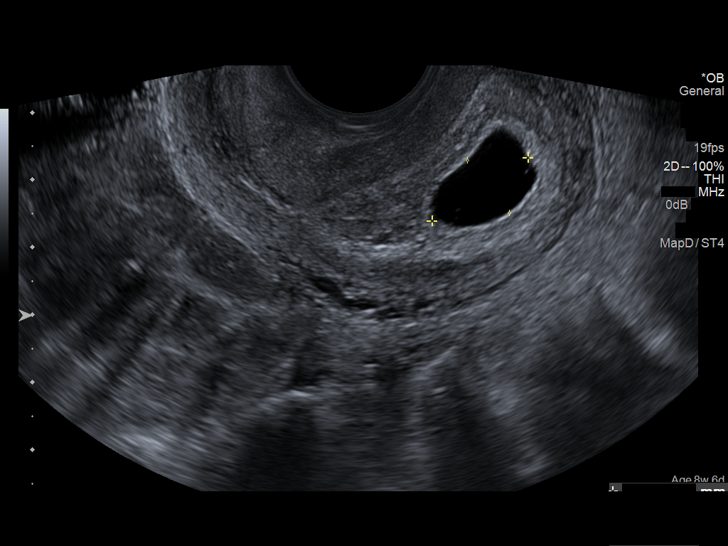
[im 31/31]
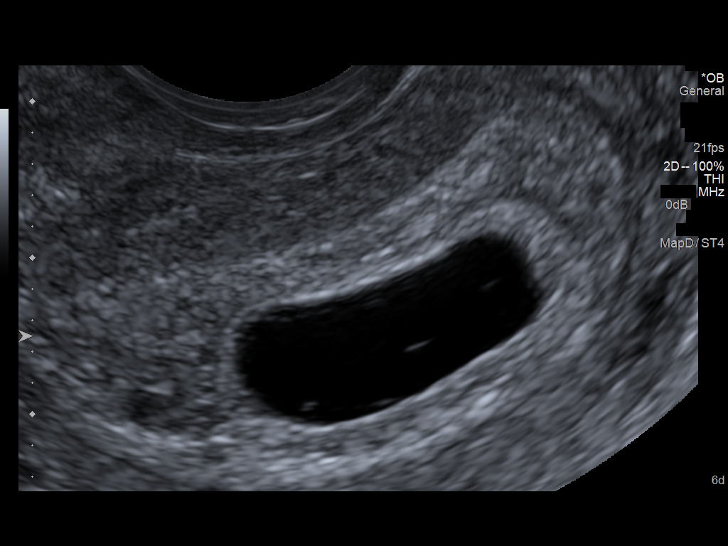

[14 of 28 positions shown; findings below may reference images not displayed]

FINDINGS: Intrauterine gestational sac: Visualized/normal in shape.

Yolk sac:  Not visualized

Embryo:  Not visualized

Cardiac Activity: Not visualized

Heart Rate:   bpm

MSD:  13.1  mm   6 w   1  d

CRL:     mm    w  d                  US EDC: 10/09/2014

Maternal uterus/adnexae: No subchorionic hemorrhage. Ovaries are
symmetric in size and echotexture. No adnexal masses. Small amount
of free fluid in the pelvis.
IMPRESSION: Early intrauterine gestational sac without yolk sac or fetal pole
currently. Recommend followup with serial quantitative beta HCGs and
repeat ultrasound in 10-14 days.

## 2014-03-05 NOTE — MAU Provider Note (Signed)
Attestation of Attending Supervision of Advanced Practitioner (CNM/NP): Evaluation and management procedures were performed by the Advanced Practitioner under my supervision and collaboration.  I have reviewed the Advanced Practitioner's note and chart, and I agree with the management and plan.  Fitzpatrick Alberico 03/05/2014 12:54 PM

## 2014-03-14 ENCOUNTER — Ambulatory Visit (INDEPENDENT_AMBULATORY_CARE_PROVIDER_SITE_OTHER): Payer: Medicaid Other | Admitting: Family Medicine

## 2014-03-14 DIAGNOSIS — O039 Complete or unspecified spontaneous abortion without complication: Secondary | ICD-10-CM

## 2014-03-14 IMAGING — US US OB TRANSVAGINAL
1 series · 14 of 28 positions shown · non-contrast
Comparison: US OB TRANSVAGINAL dated 02/14/2014

CLINICAL DATA: Threatened abortion.

EXAM:
TRANSVAGINAL OB ULTRASOUND
TECHNIQUE: Transvaginal ultrasound was performed for complete evaluation of the
gestation as well as the maternal uterus, adnexal regions, and
pelvic cul-de-sac.

[Series 1: us ob transvaginal · 14 of 45 slices shown]
[im 2/45]
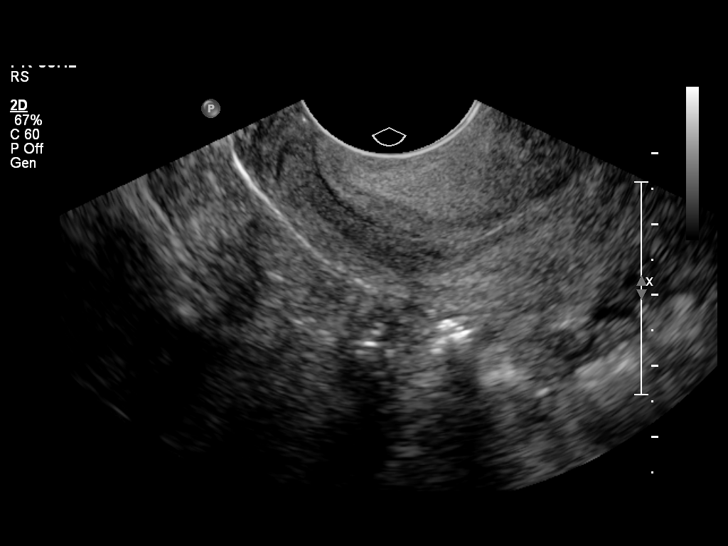
[im 5/45]
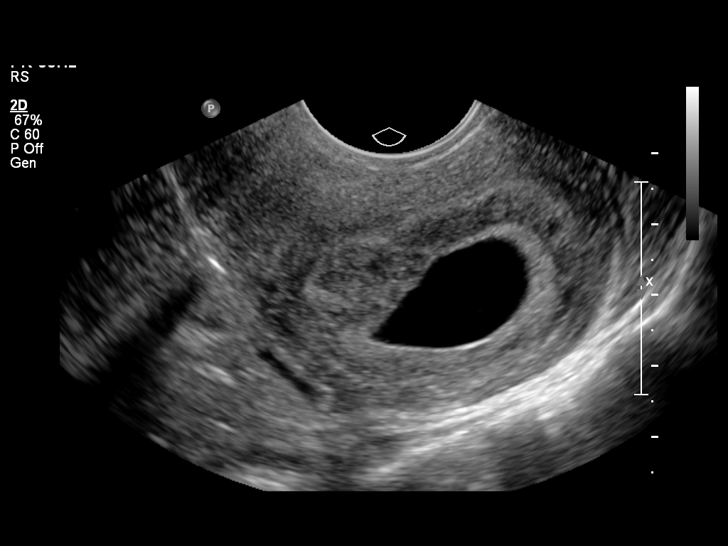
[im 9/45]
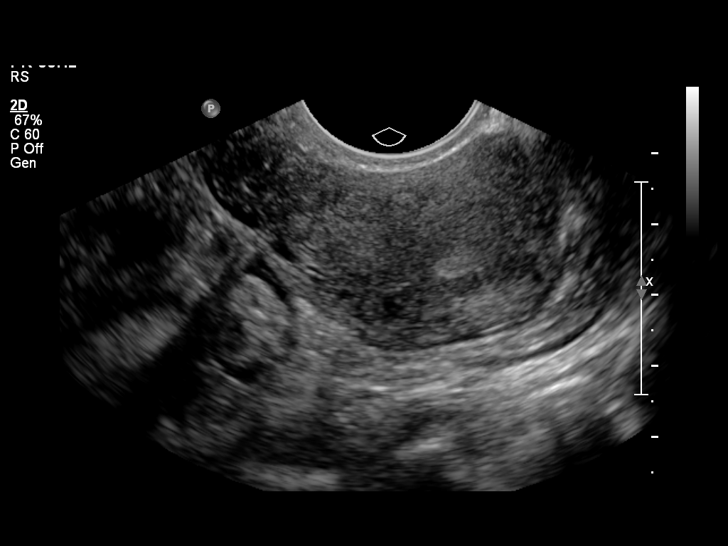
[im 12/45]
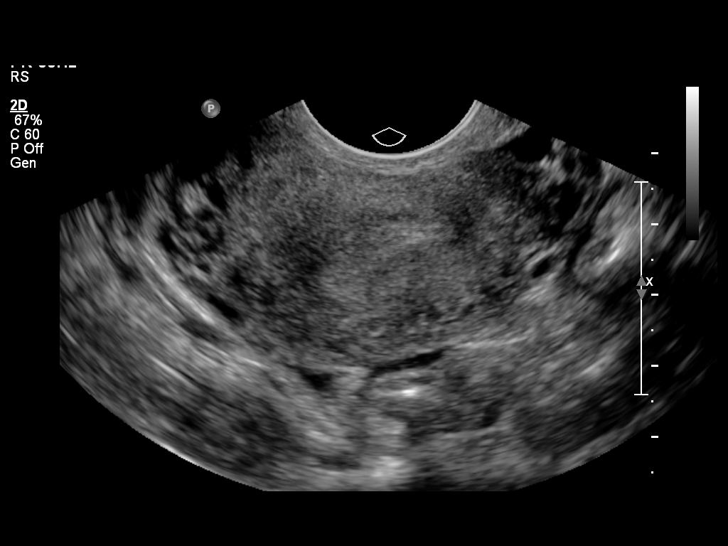
[im 15/45]
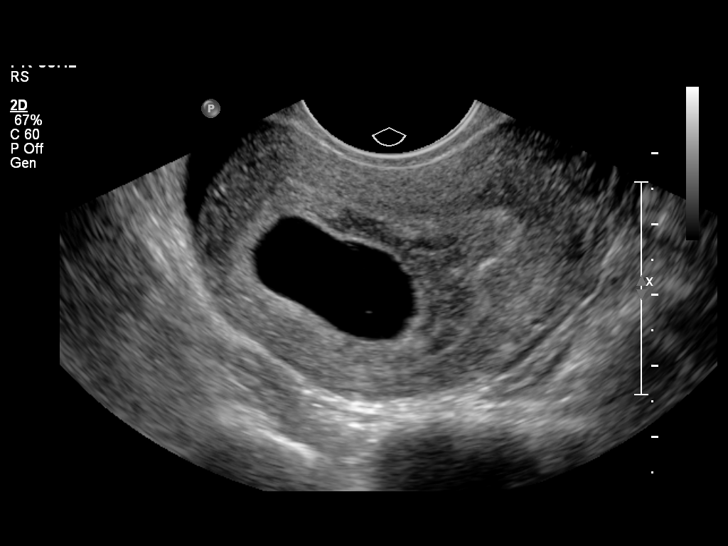
[im 18/45]
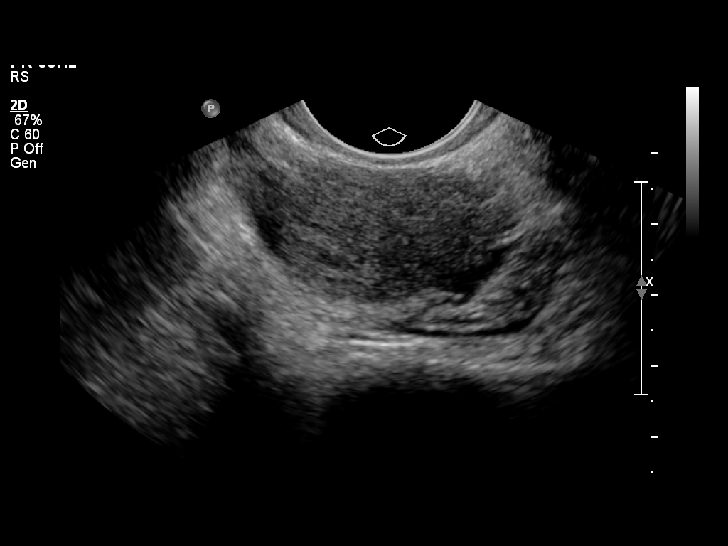
[im 22/45]
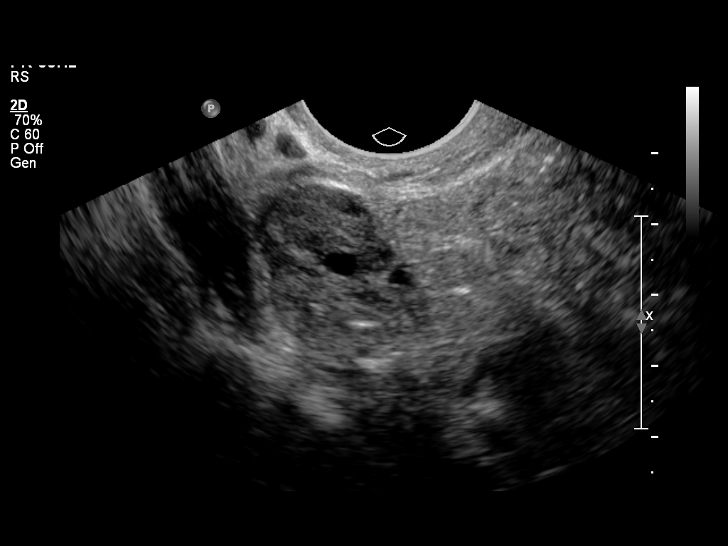
[im 25/45]
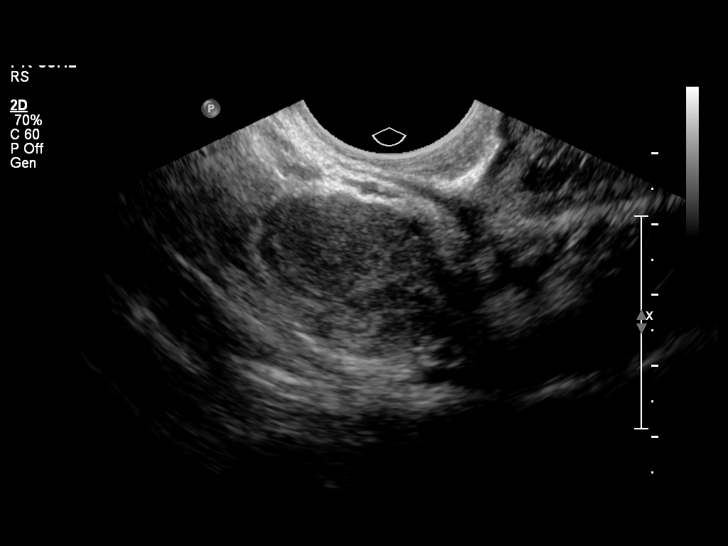
[im 28/45]
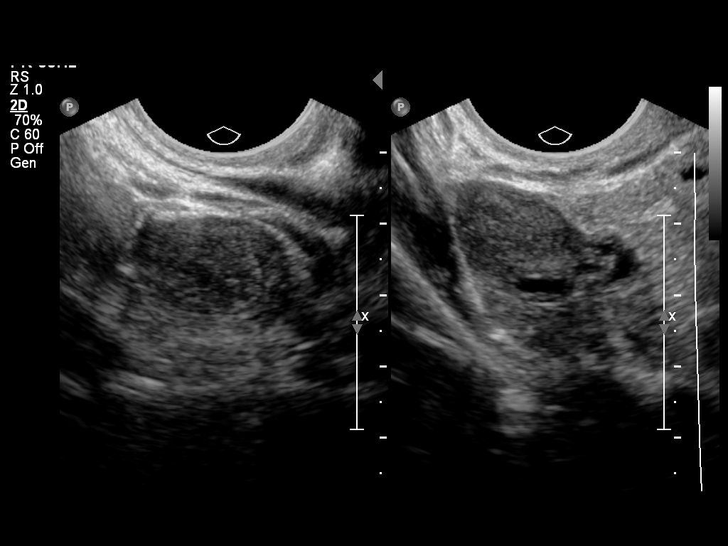
[im 31/45]
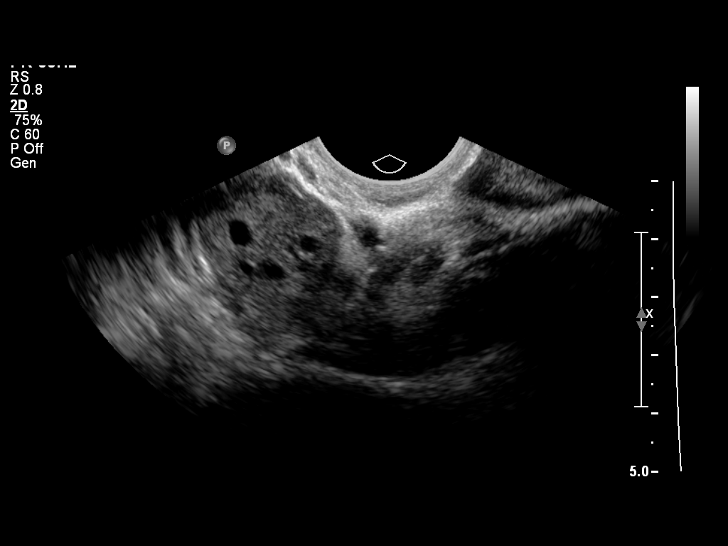
[im 35/45]
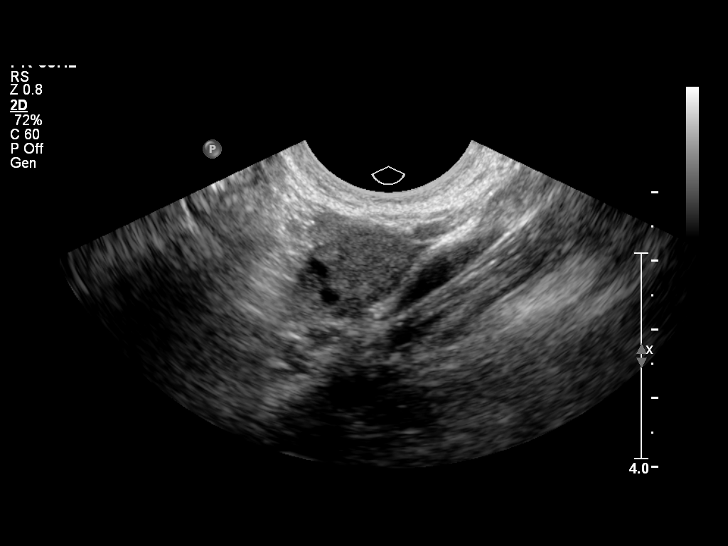
[im 38/45]
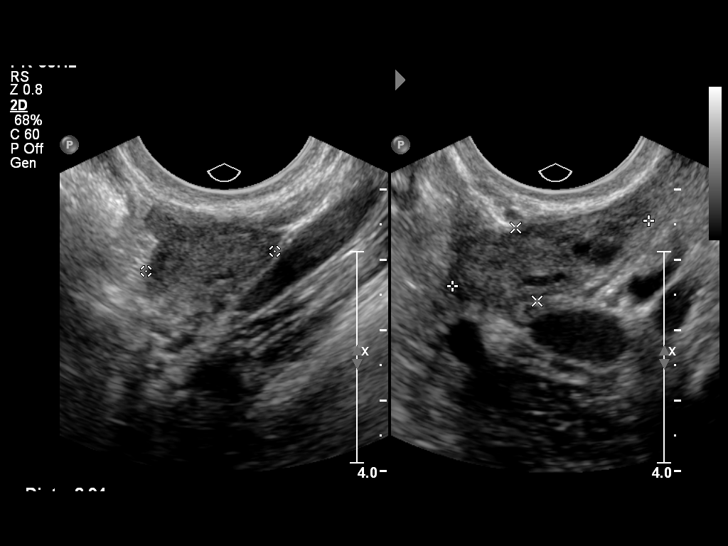
[im 41/45]
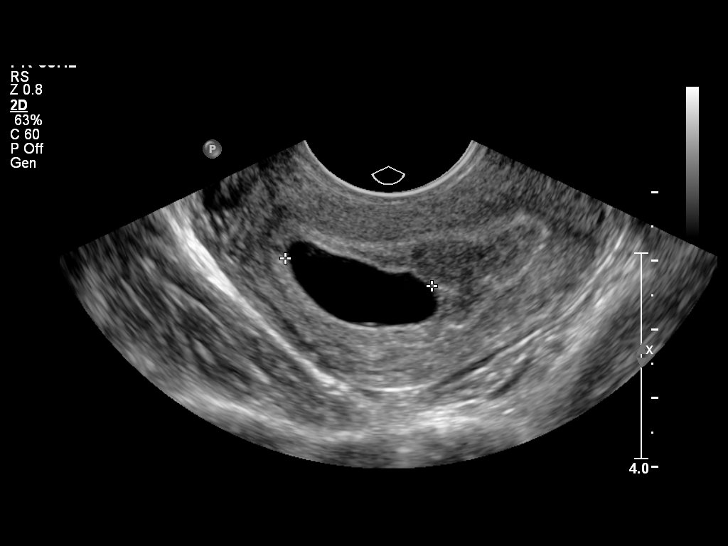
[im 45/45]
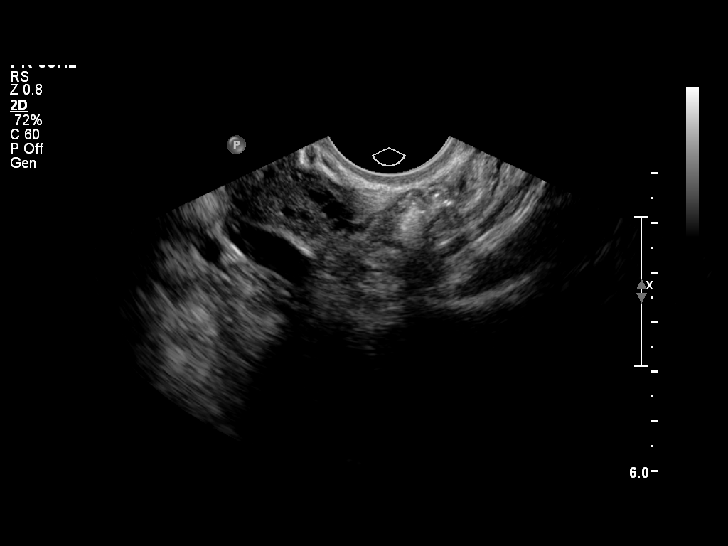

[14 of 28 positions shown; findings below may reference images not displayed]

FINDINGS: Intrauterine gestational sac: Single.

Yolk sac:  Absent.

Embryo:  Absent.

Cardiac Activity: Absent.

MSD: 21  mm   7 w   1  d

Maternal uterus/adnexae: No subchorionic hemorrhage. Ovaries are
unremarkable with a probable corpus luteum cyst on the right. Small
free fluid.
IMPRESSION: 1. Empty gestational sac measuring 21 mm. Findings are suspicious
but not yet definitive for failed pregnancy. Recommend follow-up US
in 10-14 days for definitive diagnosis. This recommendation follows
SRU consensus guidelines: Diagnostic Criteria for Nonviable
Pregnancy Early in the First Trimester. N Engl J Med 0172;
[DATE].
2. Small free fluid.

## 2014-03-14 NOTE — Progress Notes (Signed)
Just came for lab work. No visit performed.

## 2014-03-15 ENCOUNTER — Telehealth: Payer: Self-pay

## 2014-03-15 LAB — HCG, QUANTITATIVE, PREGNANCY: HCG, BETA CHAIN, QUANT, S: 41 m[IU]/mL

## 2014-03-15 NOTE — Telephone Encounter (Signed)
Called pt. And stated we are calling with results please call clinic.

## 2014-03-15 NOTE — Telephone Encounter (Signed)
Message copied by Louanna RawAMPBELL, Khyson Sebesta M on Thu Mar 15, 2014  1:46 PM ------      Message from: Vale HavenBECK, KELI L      Created: Thu Mar 15, 2014 12:06 PM       Quant going down. Needs f/u quant in 2 weeks. ------

## 2014-03-16 NOTE — Telephone Encounter (Signed)
Called pt and informed pt that her quant levels are going down and that we need her to come in 2 weeks for another beta quant.  Pt stated that she would be able to come in on April 2nd @ 230 pm.

## 2014-03-19 IMAGING — US US OB TRANSVAGINAL
1 series · 14 of 18 positions shown · non-contrast
Comparison: Ob ultrasound 02/23/2014.

CLINICAL DATA: Question spontaneous abortion.

EXAM:
TRANSVAGINAL OB ULTRASOUND
TECHNIQUE: Transvaginal ultrasound was performed for complete evaluation of the
gestation as well as the maternal uterus, adnexal regions, and
pelvic cul-de-sac.

[Series 1: us ob transvaginal · 18 acquisitions, 14 frames shown]
[im 1/18]
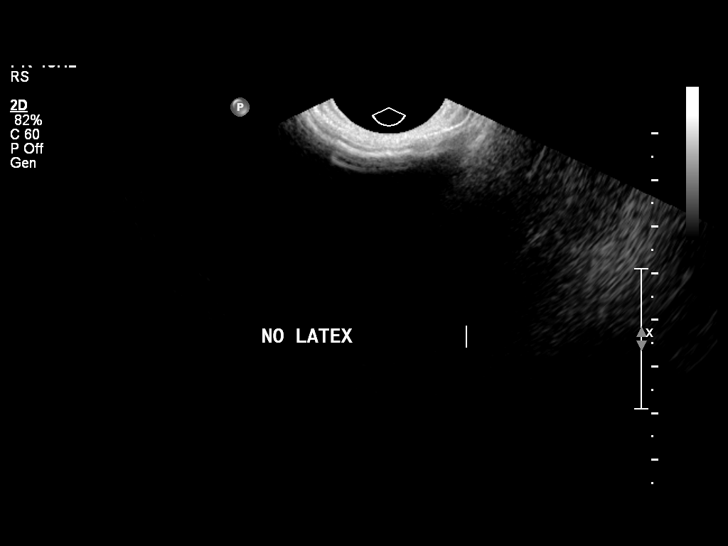
[im 2/18]
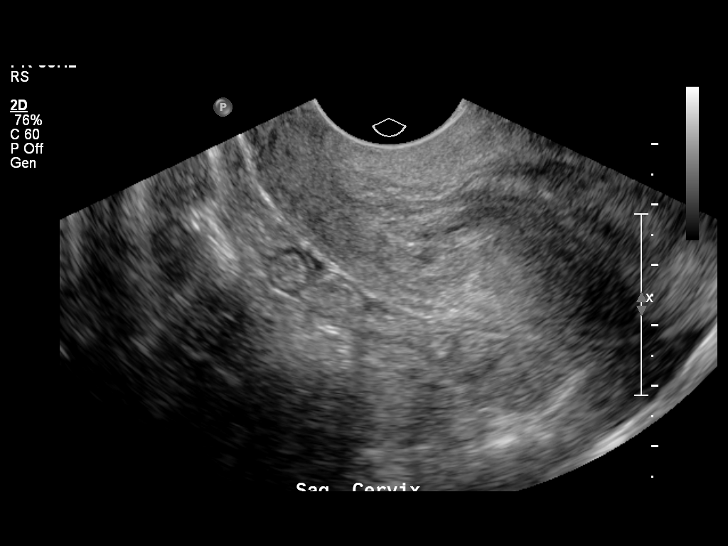
[im 4/18]
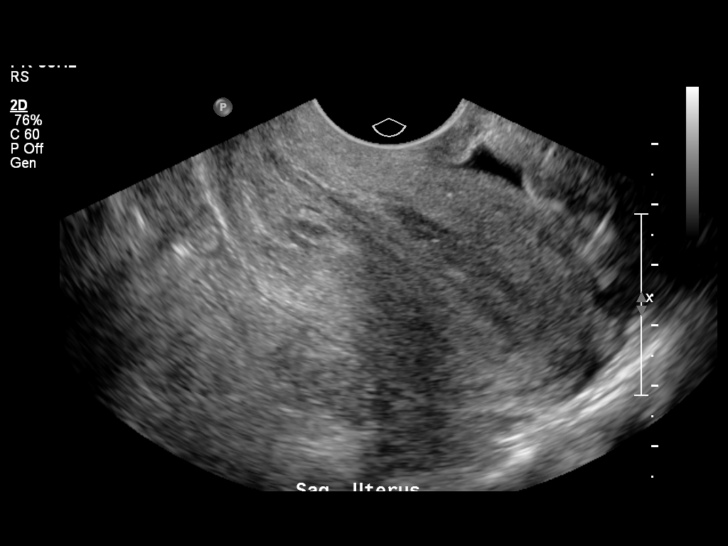
[im 5/18]
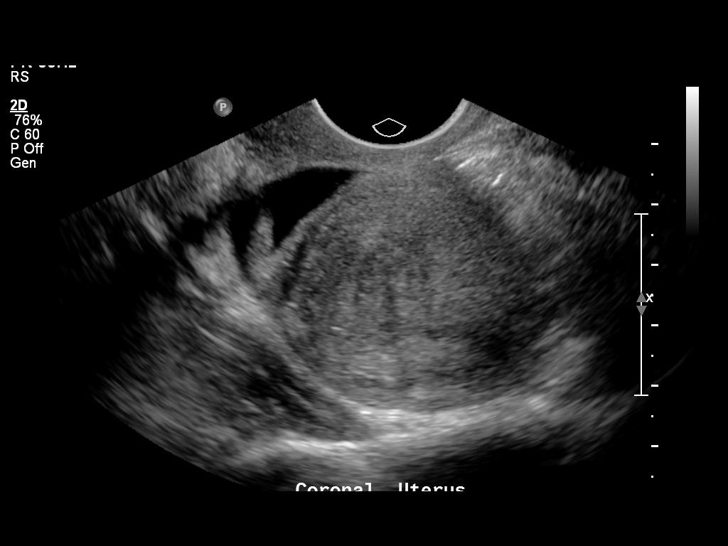
[im 6/18]
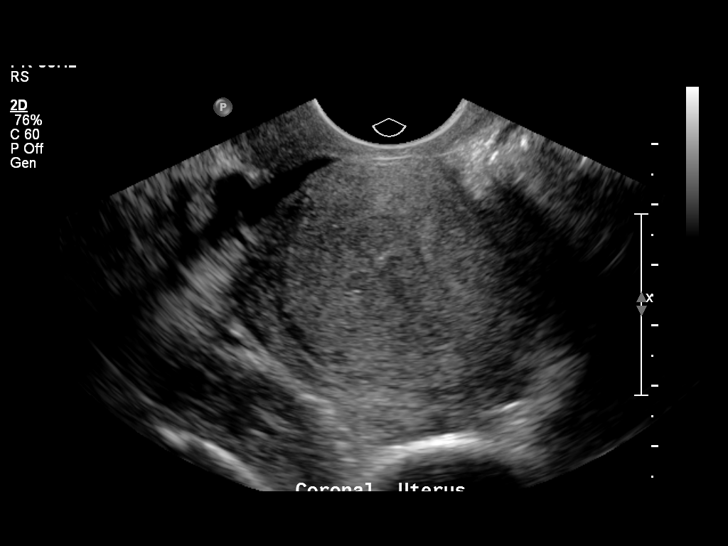
[im 8/18]
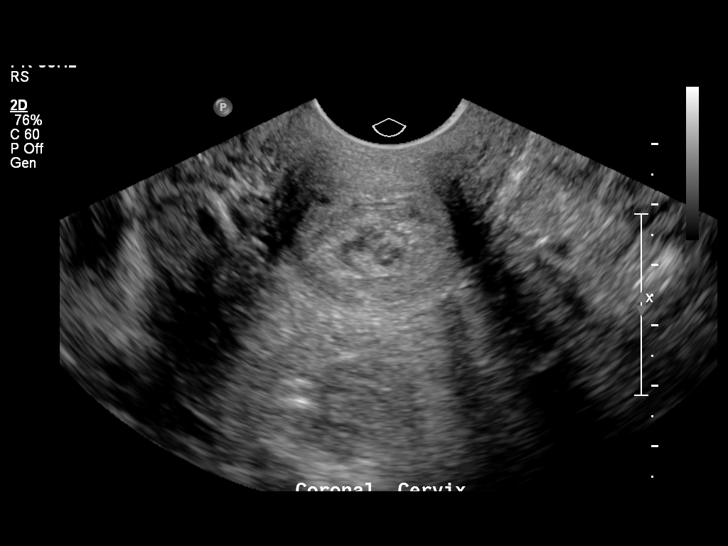
[im 9/18]
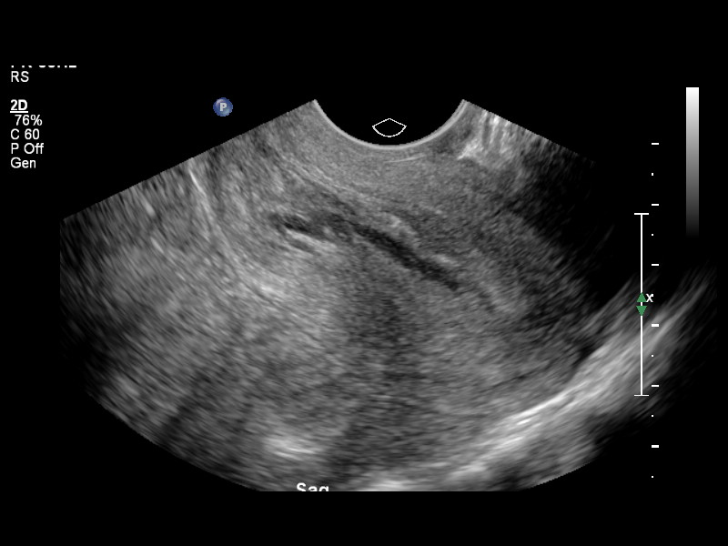
[im 10/18]
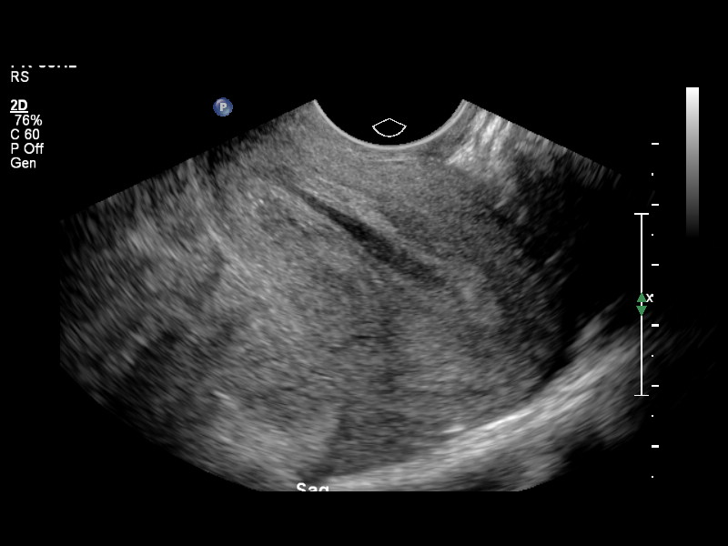
[im 11/18]
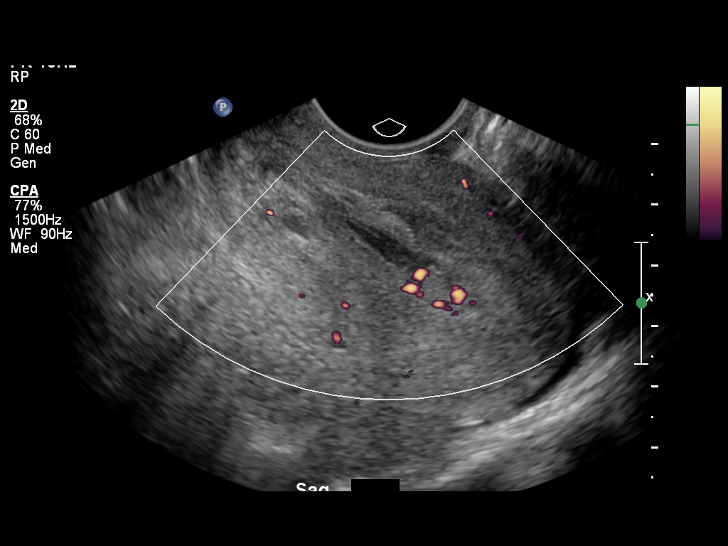
[im 13/18]
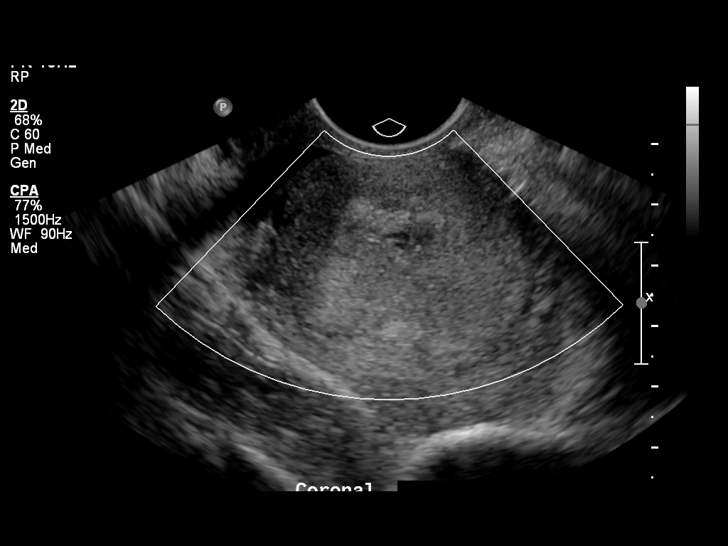
[im 14/18]
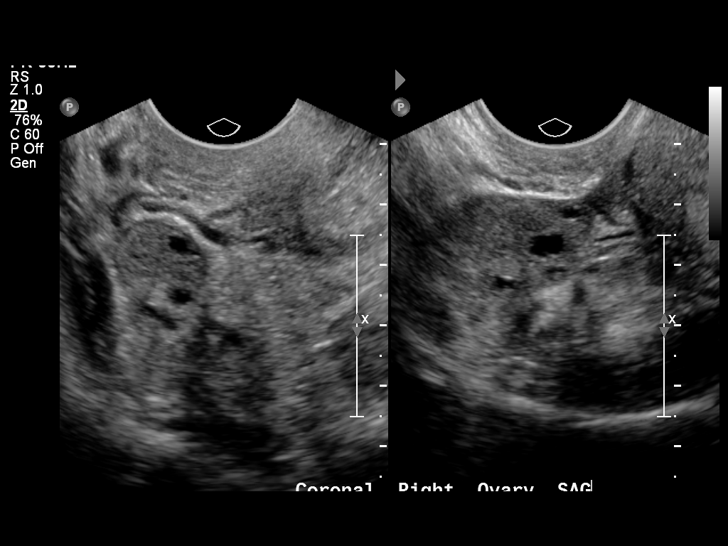
[im 15/18]
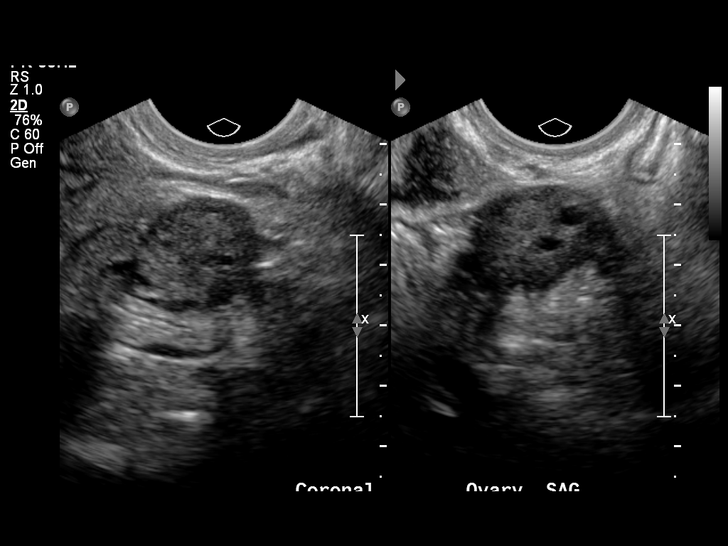
[im 17/18]
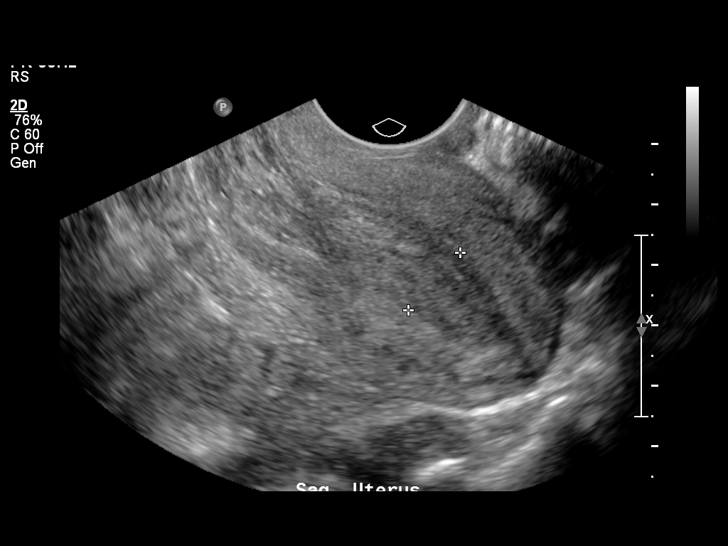
[im 18/18]
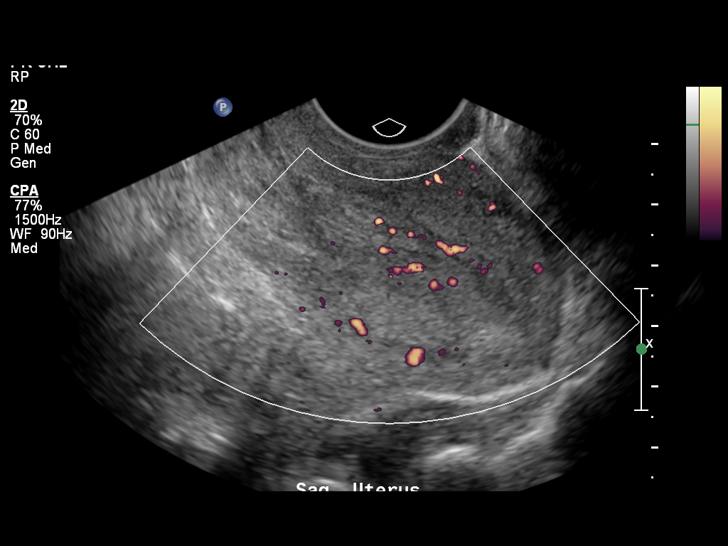

[14 of 18 positions shown; findings below may reference images not displayed]

FINDINGS: An intrauterine gestational sac is no longer visible. The
endometrium contains heterogeneous endometrial lining and blood
products, with multi focal vascular tissue in the endometrial
cavity. The ovaries are symmetric in size and normal in appearance.
No free pelvic fluid.
IMPRESSION: Failed pregnancy. Gestational products and blood is noted within the
endometrial cavity.

## 2014-03-29 ENCOUNTER — Other Ambulatory Visit: Payer: Medicaid Other

## 2014-03-29 DIAGNOSIS — E349 Endocrine disorder, unspecified: Secondary | ICD-10-CM

## 2014-03-30 LAB — HCG, QUANTITATIVE, PREGNANCY: hCG, Beta Chain, Quant, S: 3.3 m[IU]/mL

## 2014-10-29 ENCOUNTER — Encounter (HOSPITAL_COMMUNITY): Payer: Self-pay | Admitting: *Deleted

## 2014-12-21 ENCOUNTER — Encounter (HOSPITAL_COMMUNITY): Payer: Self-pay | Admitting: *Deleted

## 2016-05-06 ENCOUNTER — Ambulatory Visit (INDEPENDENT_AMBULATORY_CARE_PROVIDER_SITE_OTHER): Payer: Self-pay | Admitting: General Practice

## 2016-05-06 DIAGNOSIS — Z3201 Encounter for pregnancy test, result positive: Secondary | ICD-10-CM

## 2016-05-06 LAB — POCT PREGNANCY, URINE: Preg Test, Ur: POSITIVE — AB

## 2016-05-06 NOTE — Progress Notes (Signed)
Patient here for upt today. upt +. Reports first positive home test yesterday and SAB 2016. LMP 03/31/16 EDD 01/05/17. Patient already on PNV. Provided new OB packet & patient will make new OB appt upon leaving today.

## 2016-05-20 ENCOUNTER — Inpatient Hospital Stay (HOSPITAL_COMMUNITY)
Admission: AD | Admit: 2016-05-20 | Discharge: 2016-05-20 | Disposition: A | Discharge status: Home / Self Care | Payer: Medicaid Other | Source: Ambulatory Visit | Attending: Obstetrics & Gynecology | Admitting: Obstetrics & Gynecology

## 2016-05-20 ENCOUNTER — Encounter (HOSPITAL_COMMUNITY): Payer: Self-pay | Admitting: *Deleted

## 2016-05-20 DIAGNOSIS — Z3A01 Less than 8 weeks gestation of pregnancy: Secondary | ICD-10-CM | POA: Insufficient documentation

## 2016-05-20 DIAGNOSIS — O210 Mild hyperemesis gravidarum: Secondary | ICD-10-CM | POA: Diagnosis not present

## 2016-05-20 DIAGNOSIS — J45909 Unspecified asthma, uncomplicated: Secondary | ICD-10-CM | POA: Insufficient documentation

## 2016-05-20 DIAGNOSIS — O219 Vomiting of pregnancy, unspecified: Secondary | ICD-10-CM

## 2016-05-20 DIAGNOSIS — O99511 Diseases of the respiratory system complicating pregnancy, first trimester: Secondary | ICD-10-CM | POA: Diagnosis not present

## 2016-05-20 LAB — CBC WITH DIFFERENTIAL/PLATELET
BASOS ABS: 0 10*3/uL (ref 0.0–0.1)
Basophils Relative: 0 %
EOS PCT: 0 %
Eosinophils Absolute: 0.1 10*3/uL (ref 0.0–0.7)
HCT: 39.9 % (ref 36.0–46.0)
Hemoglobin: 14.4 g/dL (ref 12.0–15.0)
Lymphocytes Relative: 33 %
Lymphs Abs: 4.1 10*3/uL — ABNORMAL HIGH (ref 0.7–4.0)
MCH: 31.8 pg (ref 26.0–34.0)
MCHC: 36.1 g/dL — ABNORMAL HIGH (ref 30.0–36.0)
MCV: 88.1 fL (ref 78.0–100.0)
MONO ABS: 0.6 10*3/uL (ref 0.1–1.0)
Monocytes Relative: 5 %
NEUTROS ABS: 7.7 10*3/uL (ref 1.7–7.7)
Neutrophils Relative %: 62 %
PLATELETS: 340 10*3/uL (ref 150–400)
RBC: 4.53 MIL/uL (ref 3.87–5.11)
RDW: 12 % (ref 11.5–15.5)
WBC: 12.5 10*3/uL — ABNORMAL HIGH (ref 4.0–10.5)

## 2016-05-20 LAB — COMPREHENSIVE METABOLIC PANEL
ALBUMIN: 4.2 g/dL (ref 3.5–5.0)
ALT: 15 U/L (ref 14–54)
AST: 23 U/L (ref 15–41)
Alkaline Phosphatase: 42 U/L (ref 38–126)
Anion gap: 9 (ref 5–15)
BUN: 5 mg/dL — AB (ref 6–20)
CO2: 23 mmol/L (ref 22–32)
Calcium: 9.4 mg/dL (ref 8.9–10.3)
Chloride: 106 mmol/L (ref 101–111)
Creatinine, Ser: 0.5 mg/dL (ref 0.44–1.00)
GFR calc Af Amer: >60 mL/min (ref >60)
GFR calc non Af Amer: >60 mL/min (ref >60)
GLUCOSE: 110 mg/dL — AB (ref 65–99)
Potassium: 3.4 mmol/L — ABNORMAL LOW (ref 3.5–5.1)
Sodium: 138 mmol/L (ref 135–145)
Total Bilirubin: 0.5 mg/dL (ref 0.3–1.2)
Total Protein: 7.8 g/dL (ref 6.5–8.1)

## 2016-05-20 LAB — URINE MICROSCOPIC-ADD ON

## 2016-05-20 LAB — URINALYSIS, ROUTINE W REFLEX MICROSCOPIC
Bilirubin Urine: NEGATIVE
GLUCOSE, UA: NEGATIVE mg/dL
HGB URINE DIPSTICK: NEGATIVE
Ketones, ur: 40 mg/dL — AB
Nitrite: NEGATIVE
PROTEIN: NEGATIVE mg/dL
Specific Gravity, Urine: 1.01 (ref 1.005–1.030)
pH: 6.5 (ref 5.0–8.0)

## 2016-05-20 MED ORDER — PROMETHAZINE HCL 25 MG/ML IJ SOLN
25.0000 mg | Freq: Once | INTRAMUSCULAR | Status: AC
  Administered 2016-05-20: 25 mg via INTRAVENOUS
  Filled 2016-05-20: qty 1

## 2016-05-20 MED ORDER — PROMETHAZINE HCL 12.5 MG PO TABS: 12.5000 mg | ORAL_TABLET | Freq: Four times a day (QID) | ORAL | Status: AC | PRN

## 2016-05-20 NOTE — MAU Note (Signed)
Patient says she has been throwing up since yesterday and cant keep anything down. No other issues with pregnancy

## 2016-05-20 NOTE — MAU Provider Note (Signed)
History     CSN: [id number]  Arrival date and time: 05/20/16 4098   First Provider Initiated Contact with Patient 05/20/16 2026      Chief Complaint  Patient presents with  . Morning Sickness   HPI Angela Huynh is a 20 y.o. G2P0 at [redacted]w[redacted]d who presents to MAU today with complaint of nausea and vomiting. The patient states that she has had N/V for the last few weeks, but much worse since last night. She states that she has been unable to keep anything down since yesterday evening. She denies diarrhea, abdominal pain, vaginal bleeding, fever or UTI symptoms. She has an appointment with [facility] to start prenatal care in June.   OB History    Gravida Para Term Preterm AB TAB SAB Ectopic Multiple Living   2        1       Past Medical History  Diagnosis Date  . Asthma     History reviewed. No pertinent past surgical history.  History reviewed. No pertinent family history.  Social History  Substance Use Topics  . Smoking status: Never Smoker   . Smokeless tobacco: None  . Alcohol Use: No    Allergies:  Allergies  Allergen Reactions  . Corn-Containing Products Nausea And Vomiting    Prescriptions prior to admission  Medication Sig Dispense Refill Last Dose  . albuterol (PROAIR HFA) 108 (90 Base) MCG/ACT inhaler Inhale 2 puffs into the lungs every 6 (six) hours as needed for wheezing or shortness of breath.    Rescue  . Prenatal Vit-Fe Fumarate-FA (PRENATAL MULTIVITAMIN) TABS tablet Take 1 tablet by mouth daily at 12 noon.   05/20/2016 at Unknown time    Review of Systems  Constitutional: Negative for fever and malaise/fatigue.  Gastrointestinal: Positive for nausea and vomiting. Negative for heartburn, abdominal pain, diarrhea and constipation.  Genitourinary: Negative for dysuria, urgency and frequency.       Neg - vaginal bleeding   Physical Exam   Blood pressure 113/70, pulse 86, temperature 99.1 F (37.3 C), temperature source Oral, resp. rate 16, height   (1.575 m), weight 97 lb (43.999 kg), last menstrual period 03/31/2016, unknown if currently breastfeeding.  Physical Exam  Nursing note and vitals reviewed. Constitutional: She is oriented to person, place, and time. She appears well-developed and well-nourished. No distress.  HENT:  Head: Normocephalic and atraumatic.  Cardiovascular: Normal rate.   Respiratory: Effort normal.  GI: Soft. Bowel sounds are normal. She exhibits no distension and no mass. There is no tenderness. There is no rebound and no guarding.  Neurological: She is alert and oriented to person, place, and time.  Skin: Skin is warm and dry. No erythema.  Psychiatric: She has a normal mood and affect.    Results for orders placed or performed during the hospital encounter of 05/20/16 (from the past 24 hour(s))  Urinalysis, Routine w reflex microscopic (not at [facility])     Status: Abnormal   Collection Time: 05/20/16  7:47 PM  Result Value Ref Range   Color, Urine YELLOW YELLOW   APPearance CLEAR CLEAR   Specific Gravity, Urine 1.010 1.005 - 1.030   pH 6.5 5.0 - 8.0   Glucose, UA NEGATIVE NEGATIVE mg/dL   Hgb urine dipstick NEGATIVE NEGATIVE   Bilirubin Urine NEGATIVE NEGATIVE   Ketones, ur 40 (A) NEGATIVE mg/dL   Protein, ur NEGATIVE NEGATIVE mg/dL   Nitrite NEGATIVE NEGATIVE   Leukocytes, UA LARGE (A) NEGATIVE  Urine microscopic-add on  Status: Abnormal   Collection Time: 05/20/16  7:47 PM  Result Value Ref Range   Squamous Epithelial / LPF 0-5 (A) NONE SEEN   WBC, UA 6-30 0 - 5 WBC/hpf   RBC / HPF 0-5 0 - 5 RBC/hpf   Bacteria, UA FEW (A) NONE SEEN  CBC with Differential/Platelet     Status: Abnormal   Collection Time: 05/20/16  8:53 PM  Result Value Ref Range   WBC 12.5 (H) 4.0 - 10.5 K/uL   RBC 4.53 3.87 - 5.11 MIL/uL   Hemoglobin 14.4 12.0 - 15.0 g/dL   HCT 40.939.9 81.136.0 - 91.446.0 %   MCV 88.1 78.0 - 100.0 fL   MCH 31.8 26.0 - 34.0 pg   MCHC 36.1 (H) 30.0 - 36.0 g/dL   RDW 78.212.0 95.611.5 - 21.315.5 %   Platelets  340 150 - 400 K/uL   Neutrophils Relative % 62 %   Neutro Abs 7.7 1.7 - 7.7 K/uL   Lymphocytes Relative 33 %   Lymphs Abs 4.1 (H) 0.7 - 4.0 K/uL   Monocytes Relative 5 %   Monocytes Absolute 0.6 0.1 - 1.0 K/uL   Eosinophils Relative 0 %   Eosinophils Absolute 0.1 0.0 - 0.7 K/uL   Basophils Relative 0 %   Basophils Absolute 0.0 0.0 - 0.1 K/uL  Comprehensive metabolic panel     Status: Abnormal   Collection Time: 05/20/16  8:53 PM  Result Value Ref Range   Sodium 138 135 - 145 mmol/L   Potassium 3.4 (L) 3.5 - 5.1 mmol/L   Chloride 106 101 - 111 mmol/L   CO2 23 22 - 32 mmol/L   Glucose, Bld 110 (H) 65 - 99 mg/dL   BUN 5 (L) 6 - 20 mg/dL   Creatinine, Ser 0.860.50 0.44 - 1.00 mg/dL   Calcium 9.4 8.9 - 57.810.3 mg/dL   Total Protein 7.8 6.5 - 8.1 g/dL   Albumin 4.2 3.5 - 5.0 g/dL   AST 23 15 - 41 U/L   ALT 15 14 - 54 U/L   Alkaline Phosphatase 42 38 - 126 U/L   Total Bilirubin 0.5 0.3 - 1.2 mg/dL   GFR calc non Af Amer >60 >60 mL/min   GFR calc Af Amer >60 >60 mL/min   Anion gap 9 5 - 15    MAU Course  Procedures None  MDM UA, CBC, CMP today  IV LR with 25 mg Phenergan infusion Patient reports significant improvmen tin symptoms. No emesis in mau. Able to tolerate PO  Assessment and Plan  A: Positive pregnancy test Nausea and vom in pregnancy prior to 22 weeks  P: Discharge home Rx for phenergan given to patient  Diet for N/V in prgnancy discussed Patient advised to follow-up with [facility] as planned to start prenatal care Patient may return to MAU as needed or if her condition were to change or worsen   Angela LowensteinJulie N Timmya Blazier, PA-C  05/20/2016, 10:43 PM

## 2016-05-20 NOTE — Discharge Instructions (Signed)
Morning Sickness °Morning sickness is when you feel sick to your stomach (nauseous) during pregnancy. You may feel sick to your stomach and throw up (vomit). You may feel sick in the morning, but you can feel this way any time of day. Some women feel very sick to their stomach and cannot stop throwing up (hyperemesis gravidarum). °HOME CARE °· Only take medicines as told by your doctor. °· Take multivitamins as told by your doctor. Taking multivitamins before getting pregnant can stop or lessen the harshness of morning sickness. °· Eat dry toast or unsalted crackers before getting out of bed. °· Eat 5 to 6 small meals a day. °· Eat dry and bland foods like rice and baked potatoes. °· Do not drink liquids with meals. Drink between meals. °· Do not eat greasy, fatty, or spicy foods. °· Have someone cook for you if the smell of food causes you to feel sick or throw up. °· If you feel sick to your stomach after taking prenatal vitamins, take them at night or with a snack. °· Eat protein when you need a snack (nuts, yogurt, cheese). °· Eat unsweetened gelatins for dessert. °· Wear a bracelet used for sea sickness (acupressure wristband). °· Go to a doctor that puts thin needles into certain body points (acupuncture) to improve how you feel. °· Do not smoke. °· Use a humidifier to keep the air in your house free of odors. °· Get lots of fresh air. °GET HELP IF: °· You need medicine to feel better. °· You feel dizzy or lightheaded. °· You are losing weight. °GET HELP RIGHT AWAY IF:  °· You feel very sick to your stomach and cannot stop throwing up. °· You pass out (faint). °MAKE SURE YOU: °· Understand these instructions. °· Will watch your condition. °· Will get help right away if you are not doing well or get worse. °  °This information is not intended to replace advice given to you by your health care provider. Make sure you discuss any questions you have with your health care provider. °  °Document Released: 01/21/2005  Document Revised: 01/04/2015 Document Reviewed: 05/31/2013 °Elsevier Interactive Patient Education ©2016 [employer] °Eating Plan for Hyperemesis Gravidarum °Severe cases of hyperemesis gravidarum can lead to dehydration and malnutrition. The hyperemesis eating plan is one way to lessen the symptoms of nausea and vomiting. It is often used with prescribed medicines to control your symptoms.  °WHAT CAN I DO TO RELIEVE MY SYMPTOMS? °Listen to your body. Everyone is different and has different preferences. Find what works best for you. Some of the following things may help: °· Eat and drink slowly. °· Eat 5-6 small meals daily instead of 3 large meals.   °· Eat crackers before you get out of bed in the morning.   °· Starchy foods are usually well tolerated (such as cereal, toast, bread, potatoes, pasta, rice, and pretzels).   °· Ginger may help with nausea. Add ¼ tsp ground ginger to hot tea or choose ginger tea.   °· Try drinking 100% fruit juice or an electrolyte drink. °· Continue to take your prenatal vitamins as directed by your health care provider. If you are having trouble taking your prenatal vitamins, talk with your health care provider about different options. °· Include at least 1 serving of protein with your meals and snacks (such as meats or poultry, beans, nuts, eggs, or yogurt). Try eating a protein-rich snack before bed (such as cheese and crackers or a half turkey or peanut butter sandwich). °  WHAT THINGS SHOULD I AVOID TO REDUCE MY SYMPTOMS? °The following things may help reduce your symptoms: °· Avoid foods with strong smells. Try eating meals in well-ventilated areas that are free of odors. °· Avoid drinking water or other beverages with meals. Try not to drink anything less than 30 minutes before and after meals. °· Avoid drinking more than 1 cup of fluid at a time. °· Avoid fried or high-fat foods, such as butter and cream sauces. °· Avoid spicy foods. °· Avoid skipping meals the best you can.  Nausea can be more intense on an empty stomach. If you cannot tolerate food at that time, do not force it. Try sucking on ice chips or other frozen items and make up the calories later. °· Avoid lying down within 2 hours after eating. °  °This information is not intended to replace advice given to you by your health care provider. Make sure you discuss any questions you have with your health care provider. °  °Document Released: 10/11/2007 Document Revised: 12/19/2013 Document Reviewed: 10/18/2013 °Elsevier Interactive Patient Education ©2016 [employer]. ° °

## 2016-06-17 ENCOUNTER — Encounter: Payer: Medicaid Other | Admitting: Advanced Practice Midwife

## 2016-07-14 ENCOUNTER — Encounter: Payer: Medicaid Other | Admitting: Advanced Practice Midwife

## 2017-03-25 ENCOUNTER — Encounter (HOSPITAL_COMMUNITY): Payer: Self-pay

## 2017-12-13 ENCOUNTER — Other Ambulatory Visit: Payer: Self-pay

## 2017-12-13 ENCOUNTER — Encounter (HOSPITAL_COMMUNITY): Payer: Self-pay | Admitting: Emergency Medicine

## 2017-12-13 ENCOUNTER — Ambulatory Visit (HOSPITAL_COMMUNITY)
Admission: EM | Admit: 2017-12-13 | Discharge: 2017-12-13 | Disposition: A | Discharge status: Home / Self Care | Payer: Medicaid Other | Attending: Family Medicine | Admitting: Family Medicine

## 2017-12-13 DIAGNOSIS — J069 Acute upper respiratory infection, unspecified: Secondary | ICD-10-CM

## 2017-12-13 NOTE — ED Triage Notes (Signed)
Pt c/o nausea, sore throat, coughing.

## 2017-12-13 NOTE — Discharge Instructions (Signed)
You likely having a viral upper respiratory infection. We recommended symptom control. I expect your symptoms to start improving in the next 1-2 weeks.   1. Take a daily allergy pill/anti-histamine like Zyrtec, Claritin, or Store brand consistently for 2 weeks  2. For congestion you may try an oral decongestant like Mucinex or sudafed. You may also try intranasal flonase nasal spray or saline irrigations (neti pot, sinus cleanse)  3. For your sore throat you may try cepacol lozenges, salt water gargles, throat spray. Treatment of congestion may also help your sore throat.  4. For cough you may try Robitussen, Mucinex DM  5. Take Tylenol or Ibuprofen to help with pain/inflammation  6. Stay hydrated, drink plenty of fluids to keep throat coated and less irritated

## 2017-12-13 NOTE — ED Provider Notes (Signed)
[facility]    CSN: [id number] Arrival date & time: 12/13/17  1330     History   Chief Complaint Chief Complaint  Patient presents with  . Nausea  . Cough    HPI Angela Huynh is a 21 y.o. female presenting with 2 days of sore throat, congestion, fever, cough. Taking tylenol cold and flu. With 4411 month old child with similar symptoms. No fever, nausea, vomiting, shortness of breath, chest pain.  HPI  Past Medical History:  Diagnosis Date  . Asthma     There are no active problems to display for this patient.   History reviewed. No pertinent surgical history.  OB History    Gravida Para Term Preterm AB Living   2             SAB TAB Ectopic Multiple Live Births         1         Home Medications    Prior to Admission medications   Medication Sig Start Date End Date Taking? Authorizing Provider  albuterol (PROAIR HFA) 108 (90 Base) MCG/ACT inhaler Inhale 2 puffs into the lungs every 6 (six) hours as needed for wheezing or shortness of breath.     [provider]  Prenatal Vit-Fe Fumarate-FA (PRENATAL MULTIVITAMIN) TABS tablet Take 1 tablet by mouth daily at 12 noon.    [provider]  promethazine (PHENERGAN) 12.5 MG tablet Take 1 tablet (12.5 mg total) by mouth every 6 (six) hours as needed for nausea or vomiting. 05/20/16   Marny LowensteinWenzel, Marynell N, PA-C    Family History No family history on file.  Social History Social History   Tobacco Use  . Smoking status: Never Smoker  Substance Use Topics  . Alcohol use: No  . Drug use: No     Allergies   Corn-containing products   Review of Systems Review of Systems  Constitutional: Negative for chills and fever.  HENT: Positive for congestion, postnasal drip, rhinorrhea and sore throat. Negative for ear pain.   Eyes: Negative for pain and visual disturbance.  Respiratory: Positive for cough. Negative for shortness of breath.   Cardiovascular: Negative for chest pain and  palpitations.  Gastrointestinal: Negative for abdominal pain, diarrhea, nausea and vomiting.  Musculoskeletal: Positive for myalgias. Negative for arthralgias and back pain.  Skin: Negative for color change and rash.  Neurological: Negative for dizziness, weakness, light-headedness and headaches.  All other systems reviewed and are negative.    Physical Exam Triage Vital Signs ED Triage Vitals  Enc Vitals Group     BP 12/13/17 1423 113/88     Pulse Rate 12/13/17 1423 88     Resp 12/13/17 1423 18     Temp 12/13/17 1423 97.7 F (36.5 C)     Temp src --      SpO2 12/13/17 1423 100 %     Weight --      Height --      Head Circumference --      Peak Flow --      Pain Score 12/13/17 1422 8     Pain Loc --      Pain Edu? --      Excl. in GC? --    No data found.  Updated Vital Signs BP 113/88   Pulse 88   Temp 97.7 F (36.5 C)   Resp 18   SpO2 100%   Breastfeeding? No    Physical Exam  Constitutional: She is oriented  to person, place, and time. She appears well-developed and well-nourished. No distress.  HENT:  Head: Normocephalic and atraumatic.  Right Ear: Tympanic membrane and ear canal normal.  Left Ear: Tympanic membrane and ear canal normal.  Nose: Rhinorrhea present.  Mouth/Throat: Uvula is midline and mucous membranes are normal. No oral lesions. No trismus in the jaw. No uvula swelling. Posterior oropharyngeal erythema present. No oropharyngeal exudate. No tonsillar exudate.  Eyes: Conjunctivae are normal.  Neck: Neck supple.  Cardiovascular: Normal rate and regular rhythm.  No murmur heard. Pulmonary/Chest: Effort normal and breath sounds normal. No respiratory distress.  Abdominal: Soft. She exhibits no distension. There is no tenderness. There is no guarding.  Musculoskeletal: She exhibits no edema.  Neurological: She is alert and oriented to person, place, and time.  Skin: Skin is warm and dry.  Psychiatric: She has a normal mood and affect.  Nursing  note and vitals reviewed.    UC Treatments / Results  Labs (all labs ordered are listed, but only abnormal results are displayed) Labs Reviewed - No data to display  EKG  EKG Interpretation None       Radiology No results found.  Procedures Procedures (including critical care time)  Medications Ordered in UC Medications - No data to display   Initial Impression / Assessment and Plan / UC Course  I have reviewed the triage vital signs and the nursing notes.  Pertinent labs & imaging results that were available during my care of the patient were reviewed by me and considered in my medical decision making (see chart for details).     Patient presents with symptoms likely from a viral upper respiratory infection. Differential includes bacterial pneumonia, sinusitis, allergic rhinitis, acute bronchitis. Do not suspect underlying cardiopulmonary process. Symptoms seem unlikely related to ACS, CHF or COPD exacerbations, pneumonia, pneumothorax. Patient is nontoxic appearing and not in need of emergent medical intervention.  Recommended symptom control with over the counter medications: Daily oral anti-histamine, Oral decongestant or IN corticosteroid, saline irrigations, cepacol lozenges, Robitussin, Delsym, honey tea.  Return if symptoms fail to improve in 1-2 weeks or you develop shortness of breath, chest pain, severe headache. Patient states understanding and is agreeable.      Final Clinical Impressions(s) / UC Diagnoses   Final diagnoses:  Upper respiratory tract infection, unspecified type    ED Discharge Orders    None       Controlled Substance Prescriptions [place] Controlled Substance Registry consulted? Not Applicable   Lew DawesWieters, Armanii Pressnell C, [place] JerseyPA-C 12/13/17 2051

## 2020-06-13 ENCOUNTER — Other Ambulatory Visit: Payer: Self-pay

## 2020-06-13 ENCOUNTER — Emergency Department
Admission: EM | Admit: 2020-06-13 | Discharge: 2020-06-13 | Disposition: A | Discharge status: Home / Self Care | Payer: Medicaid Other | Attending: Emergency Medicine | Admitting: Emergency Medicine

## 2020-06-13 DIAGNOSIS — Z5321 Procedure and treatment not carried out due to patient leaving prior to being seen by health care provider: Secondary | ICD-10-CM | POA: Diagnosis not present

## 2020-06-13 DIAGNOSIS — R1011 Right upper quadrant pain: Secondary | ICD-10-CM | POA: Diagnosis present

## 2020-06-13 LAB — URINALYSIS, COMPLETE (UACMP) WITH MICROSCOPIC
Bilirubin Urine: NEGATIVE
Glucose, UA: NEGATIVE mg/dL
Ketones, ur: 20 mg/dL — AB
Nitrite: NEGATIVE
Protein, ur: NEGATIVE mg/dL
RBC / HPF: >50 RBC/hpf — ABNORMAL HIGH (ref 0–5)
Specific Gravity, Urine: 1.016 (ref 1.005–1.030)
WBC, UA: >50 WBC/hpf — ABNORMAL HIGH (ref 0–5)
pH: 6 (ref 5.0–8.0)

## 2020-06-13 LAB — COMPREHENSIVE METABOLIC PANEL
ALT: 15 U/L (ref 0–44)
AST: 16 U/L (ref 15–41)
Albumin: 4 g/dL (ref 3.5–5.0)
Alkaline Phosphatase: 73 U/L (ref 38–126)
Anion gap: 8 (ref 5–15)
BUN: 10 mg/dL (ref 6–20)
CO2: 27 mmol/L (ref 22–32)
Calcium: 9.4 mg/dL (ref 8.9–10.3)
Chloride: 104 mmol/L (ref 98–111)
Creatinine, Ser: 0.64 mg/dL (ref 0.44–1.00)
GFR calc Af Amer: >60 mL/min (ref >60)
GFR calc non Af Amer: >60 mL/min (ref >60)
Glucose, Bld: 102 mg/dL — ABNORMAL HIGH (ref 70–99)
Potassium: 4.2 mmol/L (ref 3.5–5.1)
Sodium: 139 mmol/L (ref 135–145)
Total Bilirubin: 0.8 mg/dL (ref 0.3–1.2)
Total Protein: 8.4 g/dL — ABNORMAL HIGH (ref 6.5–8.1)

## 2020-06-13 LAB — CBC
HCT: 40.5 % (ref 36.0–46.0)
Hemoglobin: 14.1 g/dL (ref 12.0–15.0)
MCH: 32.6 pg (ref 26.0–34.0)
MCHC: 34.8 g/dL (ref 30.0–36.0)
MCV: 93.8 fL (ref 80.0–100.0)
Platelets: 463 10*3/uL — ABNORMAL HIGH (ref 150–400)
RBC: 4.32 MIL/uL (ref 3.87–5.11)
RDW: 11.9 % (ref 11.5–15.5)
WBC: 16.5 10*3/uL — ABNORMAL HIGH (ref 4.0–10.5)
nRBC: 0 % (ref 0.0–0.2)

## 2020-06-13 LAB — LIPASE, BLOOD: Lipase: 20 U/L (ref 11–51)

## 2020-06-13 LAB — POCT PREGNANCY, URINE: Preg Test, Ur: NEGATIVE

## 2020-06-13 NOTE — ED Triage Notes (Signed)
Pt in with co co RUQ pain that started over a week ago. Was at [facility] ED last night and had CT scan and blood work and was told it may be gallstones and was discharged home. Pt here for persistent pain.
